# Patient Record
Sex: Male | Born: 1945 | Race: White | Hispanic: No | Marital: Married | Smoking: Never smoker
Health system: Southern US, Community
[De-identification: ages and names within clinical notes are randomized; demographics above are authoritative.]

## PROBLEM LIST (undated history)

## (undated) DIAGNOSIS — N201 Calculus of ureter: Secondary | ICD-10-CM

## (undated) DIAGNOSIS — M1990 Unspecified osteoarthritis, unspecified site: Secondary | ICD-10-CM

## (undated) DIAGNOSIS — I10 Essential (primary) hypertension: Secondary | ICD-10-CM

## (undated) DIAGNOSIS — E7800 Pure hypercholesterolemia, unspecified: Secondary | ICD-10-CM

## (undated) DIAGNOSIS — E669 Obesity, unspecified: Secondary | ICD-10-CM

## (undated) DIAGNOSIS — Z9889 Other specified postprocedural states: Secondary | ICD-10-CM

## (undated) DIAGNOSIS — K625 Hemorrhage of anus and rectum: Secondary | ICD-10-CM

## (undated) DIAGNOSIS — Z860100 Personal history of colon polyps, unspecified: Secondary | ICD-10-CM

## (undated) DIAGNOSIS — IMO0001 Reserved for inherently not codable concepts without codable children: Secondary | ICD-10-CM

## (undated) DIAGNOSIS — K219 Gastro-esophageal reflux disease without esophagitis: Secondary | ICD-10-CM

## (undated) DIAGNOSIS — R112 Nausea with vomiting, unspecified: Secondary | ICD-10-CM

## (undated) DIAGNOSIS — T4145XA Adverse effect of unspecified anesthetic, initial encounter: Secondary | ICD-10-CM

## (undated) DIAGNOSIS — T8859XA Other complications of anesthesia, initial encounter: Secondary | ICD-10-CM

## (undated) HISTORY — PX: PALATE / UVULA BIOPSY / EXCISION: SUR128

## (undated) HISTORY — PX: MOUTH SURGERY: SHX715

## (undated) HISTORY — PX: BILATERAL KNEE ARTHROSCOPY: SUR91

## (undated) HISTORY — PX: APPENDECTOMY: SHX54

## (undated) HISTORY — PX: TONSILLECTOMY: SUR1361

## (undated) HISTORY — PX: ESOPHAGOGASTRODUODENOSCOPY: SHX1529

---

## 2015-02-27 ENCOUNTER — Emergency Department (HOSPITAL_BASED_OUTPATIENT_CLINIC_OR_DEPARTMENT_OTHER)
Admission: EM | Admit: 2015-02-27 | Discharge: 2015-02-27 | Disposition: A | Discharge status: Home / Self Care | Payer: Medicare Other | Attending: Emergency Medicine | Admitting: Emergency Medicine

## 2015-02-27 ENCOUNTER — Encounter (HOSPITAL_BASED_OUTPATIENT_CLINIC_OR_DEPARTMENT_OTHER): Payer: Self-pay | Admitting: *Deleted

## 2015-02-27 ENCOUNTER — Emergency Department (HOSPITAL_BASED_OUTPATIENT_CLINIC_OR_DEPARTMENT_OTHER): Payer: Medicare Other

## 2015-02-27 DIAGNOSIS — I10 Essential (primary) hypertension: Secondary | ICD-10-CM | POA: Insufficient documentation

## 2015-02-27 DIAGNOSIS — N201 Calculus of ureter: Secondary | ICD-10-CM | POA: Diagnosis not present

## 2015-02-27 DIAGNOSIS — K219 Gastro-esophageal reflux disease without esophagitis: Secondary | ICD-10-CM | POA: Insufficient documentation

## 2015-02-27 DIAGNOSIS — R109 Unspecified abdominal pain: Secondary | ICD-10-CM | POA: Diagnosis present

## 2015-02-27 HISTORY — DX: Reserved for inherently not codable concepts without codable children: IMO0001

## 2015-02-27 HISTORY — DX: Gastro-esophageal reflux disease without esophagitis: K21.9

## 2015-02-27 HISTORY — DX: Essential (primary) hypertension: I10

## 2015-02-27 LAB — URINALYSIS, ROUTINE W REFLEX MICROSCOPIC
Bilirubin Urine: NEGATIVE
Glucose, UA: NEGATIVE mg/dL
Ketones, ur: NEGATIVE mg/dL
Leukocytes, UA: NEGATIVE
Nitrite: NEGATIVE
Protein, ur: NEGATIVE mg/dL
Specific Gravity, Urine: >1.046 — ABNORMAL HIGH (ref 1.005–1.030)
Urobilinogen, UA: 0.2 mg/dL (ref 0.0–1.0)
pH: 5 (ref 5.0–8.0)

## 2015-02-27 LAB — CBC WITH DIFFERENTIAL/PLATELET
Basophils Absolute: 0 10*3/uL (ref 0.0–0.1)
Basophils Relative: 0 % (ref 0–1)
Eosinophils Absolute: 0.2 10*3/uL (ref 0.0–0.7)
Eosinophils Relative: 2 % (ref 0–5)
HCT: 47.1 % (ref 39.0–52.0)
Hemoglobin: 16 g/dL (ref 13.0–17.0)
Lymphocytes Relative: 13 % (ref 12–46)
Lymphs Abs: 1.4 10*3/uL (ref 0.7–4.0)
MCH: 28.8 pg (ref 26.0–34.0)
MCHC: 34 g/dL (ref 30.0–36.0)
MCV: 84.7 fL (ref 78.0–100.0)
Monocytes Absolute: 0.7 10*3/uL (ref 0.1–1.0)
Monocytes Relative: 6 % (ref 3–12)
Neutro Abs: 8.8 10*3/uL — ABNORMAL HIGH (ref 1.7–7.7)
Neutrophils Relative %: 79 % — ABNORMAL HIGH (ref 43–77)
Platelets: 215 10*3/uL (ref 150–400)
RBC: 5.56 MIL/uL (ref 4.22–5.81)
RDW: 14 % (ref 11.5–15.5)
WBC: 11 10*3/uL — ABNORMAL HIGH (ref 4.0–10.5)

## 2015-02-27 LAB — BASIC METABOLIC PANEL
Anion gap: 13 (ref 5–15)
BUN: 32 mg/dL — ABNORMAL HIGH (ref 6–20)
CO2: 22 mmol/L (ref 22–32)
Calcium: 9.2 mg/dL (ref 8.9–10.3)
Chloride: 105 mmol/L (ref 101–111)
Creatinine, Ser: 1.26 mg/dL — ABNORMAL HIGH (ref 0.61–1.24)
GFR calc Af Amer: >60 mL/min (ref >60)
GFR calc non Af Amer: 57 mL/min — ABNORMAL LOW (ref >60)
Glucose, Bld: 140 mg/dL — ABNORMAL HIGH (ref 65–99)
Potassium: 3.6 mmol/L (ref 3.5–5.1)
Sodium: 140 mmol/L (ref 135–145)

## 2015-02-27 LAB — URINE MICROSCOPIC-ADD ON

## 2015-02-27 MED ORDER — IOHEXOL 300 MG/ML  SOLN
25.0000 mL | Freq: Once | INTRAMUSCULAR | Status: AC | PRN
  Administered 2015-02-27: 25 mL via ORAL

## 2015-02-27 MED ORDER — ONDANSETRON HCL 4 MG PO TABS: 4.0000 mg | ORAL_TABLET | Freq: Four times a day (QID) | ORAL | Status: DC

## 2015-02-27 MED ORDER — OXYCODONE-ACETAMINOPHEN 5-325 MG PO TABS: 1.0000 | ORAL_TABLET | ORAL | Status: DC | PRN

## 2015-02-27 MED ORDER — ONDANSETRON HCL 4 MG/2ML IJ SOLN: 4.0000 mg | Freq: Once | INTRAMUSCULAR | Status: DC

## 2015-02-27 MED ORDER — ONDANSETRON HCL 4 MG/2ML IJ SOLN
INTRAMUSCULAR | Status: AC
  Filled 2015-02-27: qty 2

## 2015-02-27 MED ORDER — SODIUM CHLORIDE 0.9 % IV BOLUS (SEPSIS)
1000.0000 mL | Freq: Once | INTRAVENOUS | Status: AC
Start: 2015-02-27 — End: 2015-02-27
  Administered 2015-02-27: 1000 mL via INTRAVENOUS

## 2015-02-27 MED ORDER — HYDROMORPHONE HCL 1 MG/ML IJ SOLN
1.0000 mg | Freq: Once | INTRAMUSCULAR | Status: AC
  Administered 2015-02-27: 1 mg via INTRAVENOUS
  Filled 2015-02-27: qty 1

## 2015-02-27 MED ORDER — ONDANSETRON HCL 4 MG/2ML IJ SOLN
4.0000 mg | Freq: Once | INTRAMUSCULAR | Status: AC
  Administered 2015-02-27: 4 mg via INTRAVENOUS
  Filled 2015-02-27: qty 2

## 2015-02-27 MED ORDER — IOHEXOL 300 MG/ML  SOLN
100.0000 mL | Freq: Once | INTRAMUSCULAR | Status: AC | PRN
  Administered 2015-02-27: 100 mL via INTRAVENOUS

## 2015-02-27 MED ORDER — KETOROLAC TROMETHAMINE 15 MG/ML IJ SOLN
15.0000 mg | Freq: Once | INTRAMUSCULAR | Status: AC
  Administered 2015-02-27: 15 mg via INTRAVENOUS
  Filled 2015-02-27: qty 1

## 2015-02-27 MED ORDER — ONDANSETRON HCL 4 MG/2ML IJ SOLN
4.0000 mg | Freq: Once | INTRAMUSCULAR | Status: AC
  Administered 2015-02-27: 4 mg via INTRAVENOUS

## 2015-02-27 NOTE — ED Notes (Signed)
Patient states he woke up this morning around 0300 with abdominal pain.  Describes the pain as a hard pain.  States he has had a loose bm today and a normal bm at 1711 yesterday,  but the pain is not improving.  States he is having nausea with dry heaves and weakness.

## 2015-02-27 NOTE — ED Notes (Signed)
Pt returns from ct scan. Reports relief of his pain, but although the nausea is better it is still present.

## 2015-02-27 NOTE — Discharge Instructions (Signed)

## 2015-02-27 NOTE — ED Notes (Addendum)
Pt states he is not able to produce urine sample at this time. States "I just went before I came here..." pt reassured that with iv fluids he will produce sample, pt agrees to notify staff when he feels the urge to void. Pt denies any pain or nausea.

## 2015-03-11 NOTE — ED Provider Notes (Signed)
CSN: [id number]     Arrival date & time 02/27/15  0708 History   First MD Initiated Contact with Patient 02/27/15 [phone]     Chief Complaint  Patient presents with  . Abdominal Pain     (Consider location/radiation/quality/duration/timing/severity/associated sxs/prior Treatment) HPI   69yM with abdominal pain. Onset around 0300 this morning. Pain is "deep" and "hard." No appreciable exacerbating or relieving factors. Worse on L abdomen. No urinary complaints. Two BMs today. No blood. Nausea and dry heaving. No fever or chills. No sick contacts. No hx of similar type pain.   Past Medical History  Diagnosis Date  . Hypertension   . Reflux    Past Surgical History  Procedure Laterality Date  . Appendectomy     No family history on file. History  Substance Use Topics  . Smoking status: Never Smoker   . Smokeless tobacco: Never Used  . Alcohol Use: Yes     Comment: social    Review of Systems All systems reviewed and negative, other than as noted in HPI.    Allergies  Review of patient's allergies indicates no known allergies.  Home Medications   Prior to Admission medications   Medication Sig Start Date End Date Taking? Authorizing Provider  AmLODIPine Besylate (NORVASC PO) Take by mouth.   Yes Historical Provider, MD  ATORVASTATIN CALCIUM PO Take by mouth.   Yes Historical Provider, MD  Esomeprazole Magnesium (NEXIUM PO) Take by mouth.   Yes Historical Provider, MD  ondansetron (ZOFRAN) 4 MG tablet Take 1 tablet (4 mg total) by mouth every 6 (six) hours. 02/27/15   Raeford Razor, MD  oxyCODONE-acetaminophen (PERCOCET/ROXICET) 5-325 MG per tablet Take 1-2 tablets by mouth every 4 (four) hours as needed for severe pain. 02/27/15   Raeford Razor, MD   BP 137/69 mmHg  Pulse 60  Temp(Src) 98 F (36.7 C) (Oral)  Resp 18  Ht  (1.803 m)  Wt 290 lb (131.543 kg)  BMI 40.46 kg/m2  SpO2 98% Physical Exam  Constitutional: He appears well-developed and well-nourished. No  distress.  HENT:  Head: Normocephalic and atraumatic.  Eyes: Conjunctivae are normal. Right eye exhibits no discharge. Left eye exhibits no discharge.  Neck: Neck supple.  Cardiovascular: Normal rate, regular rhythm and normal heart sounds.  Exam reveals no gallop and no friction rub.   No murmur heard. Pulmonary/Chest: Effort normal and breath sounds normal. No respiratory distress.  Abdominal: Soft. He exhibits no distension and no mass. There is tenderness. There is no rebound.  Mild L abdomen/L flank pain. L CVA tenderness.   Musculoskeletal: He exhibits no edema or tenderness.  Neurological: He is alert.  Skin: Skin is warm and dry.  Psychiatric: He has a normal mood and affect. His behavior is normal. Thought content normal.  Nursing note and vitals reviewed.   ED Course  Procedures (including critical care time) Labs Review Labs Reviewed  CBC WITH DIFFERENTIAL/PLATELET - Abnormal; Notable for the following:    WBC 11.0 (*)    Neutrophils Relative % 79 (*)    Neutro Abs 8.8 (*)    All other components within normal limits  BASIC METABOLIC PANEL - Abnormal; Notable for the following:    Glucose, Bld 140 (*)    BUN 32 (*)    Creatinine, Ser 1.26 (*)    GFR calc non Af Amer 57 (*)    All other components within normal limits  URINALYSIS, ROUTINE W REFLEX MICROSCOPIC (NOT AT [facility]) - Abnormal; Notable for  the following:    Specific Gravity, Urine >1.046 (*)    Hgb urine dipstick LARGE (*)    All other components within normal limits  URINE MICROSCOPIC-ADD ON    Imaging Review No results found.   EKG Interpretation   Date/Time:  Thursday February 27 2015 07:35:45 EDT Ventricular Rate:  68 PR Interval:  192 QRS Duration: 84 QT Interval:  436 QTC Calculation: 463 R Axis:   32 Text Interpretation:  Normal sinus rhythm Normal ECG Confirmed by Korissa Horsford   MD, Jorma Tassinari ([id number]) on 02/27/2015 7:54:58 AM      MDM   Final diagnoses:  Left ureteral stone   69yM with  abdominal/flank pain. CT significant for ureteral stone. Symptoms controlled. Afebrile. Expectant management.     Raeford Razor, MD 03/11/15 [phone]

## 2016-10-01 ENCOUNTER — Encounter: Payer: Self-pay | Admitting: Family Medicine

## 2017-03-30 ENCOUNTER — Ambulatory Visit (INDEPENDENT_AMBULATORY_CARE_PROVIDER_SITE_OTHER): Payer: Medicare Other

## 2017-03-30 ENCOUNTER — Ambulatory Visit (INDEPENDENT_AMBULATORY_CARE_PROVIDER_SITE_OTHER): Payer: Medicare Other | Admitting: Orthopaedic Surgery

## 2017-03-30 DIAGNOSIS — M1711 Unilateral primary osteoarthritis, right knee: Secondary | ICD-10-CM | POA: Diagnosis not present

## 2017-03-30 DIAGNOSIS — G8929 Other chronic pain: Secondary | ICD-10-CM | POA: Insufficient documentation

## 2017-03-30 DIAGNOSIS — M25561 Pain in right knee: Secondary | ICD-10-CM

## 2017-03-30 MED ORDER — METHYLPREDNISOLONE ACETATE 40 MG/ML IJ SUSP
40.0000 mg | INTRAMUSCULAR | Status: AC | PRN
  Administered 2017-03-30: 40 mg via INTRA_ARTICULAR

## 2017-03-30 MED ORDER — LIDOCAINE HCL 1 % IJ SOLN
3.0000 mL | INTRAMUSCULAR | Status: AC | PRN
  Administered 2017-03-30: 3 mL

## 2017-03-30 NOTE — Progress Notes (Signed)
Office Visit Note   Patient: Kyle Meyers           Date of Birth: 09-04-1945           MRN: [id number] Visit Date: 03/30/2017              Requested by: Rosario Adie, MD No address on file PCP: Patient, No Pcp Per   Assessment & Plan: Visit Diagnoses:  1. Chronic pain of right knee   2. Unilateral primary osteoarthritis, right knee     Plan: We went over his x-rays in detail and he understands with the severity of his knee arthritis that eventually he may need knee replacement surgery. I will have him work on quad strengthening exercises as well as activity modification and weight loss. He did wish to try steroid injection in the knee I agree with this. I prepped the knee with Betadine and alcohol carotid steroid injection without difficulty in the right knee. He is definitely a candidate for hyaluronic acid and asked we will place in his knee in 4 weeks and I talked about that in detail as well. All questions were encouraged and answered.  Follow-Up Instructions: Return in about 4 weeks (around 04/27/2017).   Orders:  Orders Placed This Encounter  Procedures  . Large Joint Injection/Arthrocentesis  . XR Knee 1-2 Views Right   No orders of the defined types were placed in this encounter.     Procedures: Large Joint Inj Date/Time: 03/30/2017 10:04 AM Performed by: Kathryne Hitch Authorized by: Kathryne Hitch   Location:  Knee Site:  R knee Ultrasound Guidance: No   Fluoroscopic Guidance: No   Arthrogram: No   Medications:  3 mL lidocaine 1 %; 40 mg methylPREDNISolone acetate 40 MG/ML     Clinical Data: No additional findings.   Subjective: No chief complaint on file. The patient is very pleasant 71 year old with chronic right knee pain. She is been worsening for to 3 years now. He is obese but does a lot of swimming at least 3 days a week. He says he can't stand long on hard surfaces without pain. He actually had surgery on that knee back  when he was much younger and he said this is more when he was in late teens and early 68s and had remove cartilage from the knee. He points to the medial joint line sources pain. Is a stabbing pain at times but he can be an aching pain that wakes him up at night. It is detrimentally affects his activities daily living, squatting life, and his mobility. He does take occasional over-the-counter medications to help with this. He's never had an injection in the knee. He is not a diabetic.  HPI  Review of Systems He currently denies any headache, shortness of breath, chest pain, fever, chills, nausea, vomiting.  Objective: Vital Signs: There were no vitals taken for this visit.  Physical Exam He is alert or 3 in no acute distress Ortho Exam He is a moderately obese individual. His right knee has varus malalignment. There is well-healed old surgical incisions around the knee. His knee has full range of motion is ligamentously stable but is significant medial joint line tenderness and patellofemoral crepitation. Specialty Comments:  No specialty comments available.  Imaging: Xr Knee 1-2 Views Right  Result Date: 03/30/2017 2 views of the right knee show varus malalignment with end-stage arthritis of the medial compartment mainly. There is peri-articular ossifies throughout the knee and sclerotic changes. There is  complete loss of medial joint space.    PMFS History: Patient Active Problem List   Diagnosis Date Noted  . Chronic pain of right knee 03/30/2017  . Unilateral primary osteoarthritis, right knee 03/30/2017   Past Medical History:  Diagnosis Date  . Hypertension   . Reflux     No family history on file.  Past Surgical History:  Procedure Laterality Date  . APPENDECTOMY     Social History   Occupational History  . Not on file.   Social History Main Topics  . Smoking status: Never Smoker  . Smokeless tobacco: Never Used  . Alcohol use Yes     Comment: social  . Drug  use: No  . Sexual activity: Not on file

## 2017-05-02 ENCOUNTER — Ambulatory Visit (INDEPENDENT_AMBULATORY_CARE_PROVIDER_SITE_OTHER): Payer: Medicare Other | Admitting: Orthopaedic Surgery

## 2017-05-09 ENCOUNTER — Ambulatory Visit (INDEPENDENT_AMBULATORY_CARE_PROVIDER_SITE_OTHER): Payer: Medicare Other | Admitting: Orthopaedic Surgery

## 2017-05-09 DIAGNOSIS — M1711 Unilateral primary osteoarthritis, right knee: Secondary | ICD-10-CM | POA: Diagnosis not present

## 2017-05-09 MED ORDER — HYLAN G-F 20 48 MG/6ML IX SOSY
48.0000 mg | PREFILLED_SYRINGE | INTRA_ARTICULAR | Status: AC | PRN
  Administered 2017-05-09: 48 mg via INTRA_ARTICULAR

## 2017-05-09 MED ORDER — LIDOCAINE HCL 1 % IJ SOLN
3.0000 mL | INTRAMUSCULAR | Status: AC | PRN
  Administered 2017-05-09: 3 mL

## 2017-05-09 NOTE — Progress Notes (Signed)
   Procedure Note  Patient: Kyle Meyers             Date of Birth: 01-Jan-1946           MRN: [id number]             Visit Date: 05/09/2017  Procedures: Visit Diagnoses: Unilateral primary osteoarthritis, right knee  Large Joint Inj Date/Time: 05/09/2017 9:53 AM Performed by: Kathryne Hitch Authorized by: Kathryne Hitch   Location:  Knee Ultrasound Guidance: No   Fluoroscopic Guidance: No   Arthrogram: No   Medications:  3 mL lidocaine 1 %; 48 mg Hylan 48 MG/6ML   The patient is here for a hyaluronic acid injection Synvisc 1 in his right knee to treat osteoarthritic pain. He's already had steroid injections before he has well-documented arthritis in that knee. On exam his painful right knee with range of motion. He has patellofemoral crepitation and varus malalignment.  He understands the rationale behind hyaluronic acid injections and tolerated this well. We had a long discussion about when to potentially have a steroid injection if he needs one in the future. All questions and concerns were answered and addressed.

## 2017-08-16 ENCOUNTER — Ambulatory Visit (INDEPENDENT_AMBULATORY_CARE_PROVIDER_SITE_OTHER): Payer: Medicare Other | Admitting: Physician Assistant

## 2017-08-16 ENCOUNTER — Encounter (INDEPENDENT_AMBULATORY_CARE_PROVIDER_SITE_OTHER): Payer: Self-pay | Admitting: Orthopaedic Surgery

## 2017-08-16 DIAGNOSIS — M1711 Unilateral primary osteoarthritis, right knee: Secondary | ICD-10-CM | POA: Diagnosis not present

## 2017-08-16 MED ORDER — METHYLPREDNISOLONE ACETATE 40 MG/ML IJ SUSP
40.0000 mg | INTRAMUSCULAR | Status: AC | PRN
  Administered 2017-08-16: 40 mg via INTRA_ARTICULAR

## 2017-08-16 MED ORDER — LIDOCAINE HCL 1 % IJ SOLN
3.0000 mL | INTRAMUSCULAR | Status: AC | PRN
  Administered 2017-08-16: 3 mL

## 2017-08-16 NOTE — Progress Notes (Signed)
   Procedure Note  Patient: Kyle MarshallJimmy Meyers             Date of Birth: Oct 04, 1945           MRN: [id number]             Visit Date: 08/16/2017  HPI: [name] is a 72 year old male who comes in today due to his right knee pain.  He has known end-stage osteoarthritis of his right knee.  He underwent a Synvisc this injection right knee on 05/09/2017.  States the injection did well for a month or so but his pain is returned to what it was prior to the injection.  Is requesting a cortisone injection in the knee today.  He is nondiabetic.  Physical exam: Right knee has good range of motion in the knee.  No abnormal warmth erythema or effusion. Procedures: Visit Diagnoses: Unilateral primary osteoarthritis, right knee  Large Joint Inj: R knee on 08/16/2017 10:25 AM Indications: pain Details: 22 G 1.5 in needle, anterolateral approach  Arthrogram: No  Medications: 3 mL lidocaine 1 %; 40 mg methylPREDNISolone acetate 40 MG/ML Outcome: tolerated well, no immediate complications Procedure, treatment alternatives, risks and benefits explained, specific risks discussed. Consent was given by the patient. Immediately prior to procedure a time out was called to verify the correct patient, procedure, equipment, support staff and site/side marked as required. Patient was prepped and draped in the usual sterile fashion.     Plan: He will follow-up in a month he is thinking about undergoing right total knee arthroplasty.  I did discuss with him some of the risk involved with total knee arthroplasty also the postoperative protocol.  Questions were encouraged and answered.

## 2017-08-17 ENCOUNTER — Ambulatory Visit (INDEPENDENT_AMBULATORY_CARE_PROVIDER_SITE_OTHER): Payer: Medicare Other | Admitting: Orthopaedic Surgery

## 2017-09-09 DIAGNOSIS — K625 Hemorrhage of anus and rectum: Secondary | ICD-10-CM

## 2017-09-09 HISTORY — DX: Hemorrhage of anus and rectum: K62.5

## 2017-09-09 HISTORY — PX: COLONOSCOPY: SHX174

## 2017-09-20 ENCOUNTER — Encounter (INDEPENDENT_AMBULATORY_CARE_PROVIDER_SITE_OTHER): Payer: Self-pay | Admitting: Orthopaedic Surgery

## 2017-09-20 ENCOUNTER — Ambulatory Visit (INDEPENDENT_AMBULATORY_CARE_PROVIDER_SITE_OTHER): Payer: Medicare Other | Admitting: Orthopaedic Surgery

## 2017-09-20 DIAGNOSIS — M1711 Unilateral primary osteoarthritis, right knee: Secondary | ICD-10-CM

## 2017-09-20 MED ORDER — HYDROCODONE-ACETAMINOPHEN 5-325 MG PO TABS: 1.0000 | ORAL_TABLET | Freq: Four times a day (QID) | ORAL | 0 refills | Status: DC | PRN

## 2017-09-20 NOTE — Addendum Note (Signed)
Addended by: Doneen PoissonBLACKMAN, Lylia Karn on: 09/20/2017 10:47 AM   Modules accepted: Orders

## 2017-09-20 NOTE — Progress Notes (Signed)
Office Visit Note   Patient: Kyle Meyers           Date of Birth: 1946-05-09           MRN: [id number] Visit Date: 09/20/2017              Requested by: No referring provider defined for this encounter. PCP: Patient, No Pcp Per   Assessment & Plan: Visit Diagnoses:  1. Unilateral primary osteoarthritis, right knee     Plan: At this point he would like to proceed with a total knee replacement.  He understands fully what the surgery involves and went over the risks and benefits of surgery in detail as well as what his intraoperative and postoperative course involved.  All questions concerns were answered and addressed.  We will work on getting this scheduled soon.  Follow-Up Instructions: Return for 2 weeks post-op.   Orders:  No orders of the defined types were placed in this encounter.  No orders of the defined types were placed in this encounter.     Procedures: No procedures performed   Clinical Data: No additional findings.   Subjective: Chief Complaint  Patient presents with  . Right Knee - Follow-up  Patient is here for continued follow-up of his right knee.  He has known and well-documented tricompartment arthritis of the right knee.  He has tried and failed conservative treatment now for well over a year.  He has a history of remote arthroscopic surgery on that right knee many years ago.  It got to be where his pain is daily.  It is 10 out of 10.  It is detrimentally affect is active daily living, quality of life, his mobility.  We have tried steroid injections in his knee as well as hyaluronic acid injections.  He has worked he is attempted weight loss as well.  On activity modification and quad strengthening exercises as well as anti-inflammatories.  At this point he does wish to proceed with a total knee arthroplasty given the failure of all his conservative treatment efforts.  He feels that his mobility is lessening and his knee is getting more stiff and his  range of motion is lessening as a result.   HPI  Review of Systems Currently denies any headache, chest pain, shortness of breath, fever, chills, nausea, vomiting.  Objective: Vital Signs: There were no vitals taken for this visit.  Physical Exam He is alert and oriented x3 and in no acute distress Ortho Exam Examination of his right knee shows varus malalignment.  There is significant medial joint line tenderness as well as patellofemoral crepitation.  His knee feels ligaments stable with good range of motion. Specialty Comments:  No specialty comments available.  Imaging: No results found. X-rays we have in our system that we reviewed again with him show the extent of his tricompartment arthritis of his right knee.  There is varus malalignment with near complete loss/severe narrowing of the medial joint line.  There is particular osteophytes in all 3 compartments.  PMFS History: Patient Active Problem List   Diagnosis Date Noted  . Chronic pain of right knee 03/30/2017  . Unilateral primary osteoarthritis, right knee 03/30/2017   Past Medical History:  Diagnosis Date  . Hypertension   . Reflux     History reviewed. No pertinent family history.  Past Surgical History:  Procedure Laterality Date  . APPENDECTOMY     Social History   Occupational History  . Not on file  Tobacco Use  .  Smoking status: Never Smoker  . Smokeless tobacco: Never Used  Substance and Sexual Activity  . Alcohol use: Yes    Comment: social  . Drug use: No  . Sexual activity: Not on file

## 2017-10-05 NOTE — Patient Instructions (Addendum)
Kyle Meyers  10/05/2017   Your procedure is scheduled on: 10-14-17   Report to [facility] Main  Entrance ARRIVE AT 530 AM. Have a seat in the Main Lobby. Please note there is a phone at the [employer]k. Please call [phone] on that phone. Someone from Short Stay will come and get you from the Main Lobby and take you to Short Stay.    Call this number if you have problems the morning of surgery [phone]   Remember: Do not eat food or drink liquids :After Midnight.     Take these medicines the morning of surgery with A SIP OF WATER: Amlodipine (Norvasc), and Atorvastatin (Lipitor)                                You may not have any metal on your body including hair pins and              piercings  Do not wear jewelry, lotions, powders or deodorant             Men may shave face and neck.   Do not bring valuables to the hospital. [name] IS NOT             RESPONSIBLE   FOR VALUABLES.  Contacts, dentures or bridgework may not be worn into surgery.  Leave suitcase in the car. After surgery it may be brought to your room.   Please read over the following fact sheets you were given: _____________________________________________________________________          [facility] - Preparing for Surgery Before surgery, you can play an important role.  Because skin is not sterile, your skin needs to be as free of germs as possible.  You can reduce the number of germs on your skin by washing with CHG (chlorahexidine gluconate) soap before surgery.  CHG is an antiseptic cleaner which kills germs and bonds with the skin to continue killing germs even after washing. Please DO NOT use if you have an allergy to CHG or antibacterial soaps.  If your skin becomes reddened/irritated stop using the CHG and inform your nurse when you arrive at Short Stay. Do not shave (including legs and underarms) for at least 48 hours prior to the first CHG shower.  You may  shave your face/neck. Please follow these instructions carefully:  1.  Shower with CHG Soap the night before surgery and the  morning of Surgery.  2.  If you choose to wash your hair, wash your hair first as usual with your  normal  shampoo.  3.  After you shampoo, rinse your hair and body thoroughly to remove the  shampoo.                           4.  Use CHG as you would any other liquid soap.  You can apply chg directly  to the skin and wash                       Gently with a scrungie or clean washcloth.  5.  Apply the CHG Soap to your body ONLY FROM THE NECK DOWN.   Do not use on face/ open  Wound or open sores. Avoid contact with eyes, ears mouth and genitals (private parts).                       Wash face,  Genitals (private parts) with your normal soap.             6.  Wash thoroughly, paying special attention to the area where your surgery  will be performed.  7.  Thoroughly rinse your body with warm water from the neck down.  8.  DO NOT shower/wash with your normal soap after using and rinsing off  the CHG Soap.                9.  Pat yourself dry with a clean towel.            10.  Wear clean pajamas.            11.  Place clean sheets on your bed the night of your first shower and do not  sleep with pets. Day of Surgery : Do not apply any lotions/deodorants the morning of surgery.  Please wear clean clothes to the hospital/surgery center.  FAILURE TO FOLLOW THESE INSTRUCTIONS MAY RESULT IN THE CANCELLATION OF YOUR SURGERY PATIENT SIGNATURE_________________________________  NURSE SIGNATURE__________________________________  ________________________________________________________________________   Kyle Meyers  An incentive spirometer is a tool that can help keep your lungs clear and active. This tool measures how well you are filling your lungs with each breath. Taking long deep breaths may help reverse or decrease the chance of developing  breathing (pulmonary) problems (especially infection) following:  A long period of time when you are unable to move or be active. BEFORE THE PROCEDURE   If the spirometer includes an indicator to show your best effort, your nurse or respiratory therapist will set it to a desired goal.  If possible, sit up straight or lean slightly forward. Try not to slouch.  Hold the incentive spirometer in an upright position. INSTRUCTIONS FOR USE  1. Sit on the edge of your bed if possible, or sit up as far as you can in bed or on a chair. 2. Hold the incentive spirometer in an upright position. 3. Breathe out normally. 4. Place the mouthpiece in your mouth and seal your lips tightly around it. 5. Breathe in slowly and as deeply as possible, raising the piston or the ball toward the top of the column. 6. Hold your breath for 3-5 seconds or for as long as possible. Allow the piston or ball to fall to the bottom of the column. 7. Remove the mouthpiece from your mouth and breathe out normally. 8. Rest for a few seconds and repeat Steps 1 through 7 at least 10 times every 1-2 hours when you are awake. Take your time and take a few normal breaths between deep breaths. 9. The spirometer may include an indicator to show your best effort. Use the indicator as a goal to work toward during each repetition. 10. After each set of 10 deep breaths, practice coughing to be sure your lungs are clear. If you have an incision (the cut made at the time of surgery), support your incision when coughing by placing a pillow or rolled up towels firmly against it. Once you are able to get out of bed, walk around indoors and cough well. You may stop using the incentive spirometer when instructed by your caregiver.  RISKS AND COMPLICATIONS  Take your time so you do not get  dizzy or light-headed.  If you are in pain, you may need to take or ask for pain medication before doing incentive spirometry. It is harder to take a deep  breath if you are having pain. AFTER USE  Rest and breathe slowly and easily.  It can be helpful to keep track of a log of your progress. Your caregiver can provide you with a simple table to help with this. If you are using the spirometer at home, follow these instructions: [place] IF:   You are having difficultly using the spirometer.  You have trouble using the spirometer as often as instructed.  Your pain medication is not giving enough relief while using the spirometer.  You develop fever of 100.5 F (38.1 C) or higher. SEEK IMMEDIATE MEDICAL CARE IF:   You cough up bloody sputum that had not been present before.  You develop fever of 102 F (38.9 C) or greater.  You develop worsening pain at or near the incision site. MAKE SURE YOU:   Understand these instructions.  Will watch your condition.  Will get help right away if you are not doing well or get worse. Document Released: 12/06/2006 Document Revised: 10/18/2011 Document Reviewed: 02/06/2007 [employer] Patient Information 2014 [employer], [place].   ________________________________________________________________________  WHAT IS A BLOOD TRANSFUSION? Blood Transfusion Information  A transfusion is the replacement of blood or some of its parts. Blood is made up of multiple cells which provide different functions.  Red blood cells carry oxygen and are used for blood loss replacement.  White blood cells fight against infection.  Platelets control bleeding.  Plasma helps clot blood.  Other blood products are available for specialized needs, such as hemophilia or other clotting disorders. BEFORE THE TRANSFUSION  Who gives blood for transfusions?   Healthy volunteers who are fully evaluated to make sure their blood is safe. This is blood bank blood. Transfusion therapy is the safest it has ever been in the practice of medicine. Before blood is taken from a donor, a complete history is taken to make sure  that person has no history of diseases nor engages in risky social behavior (examples are intravenous drug use or sexual activity with multiple partners). The donor's travel history is screened to minimize risk of transmitting infections, such as malaria. The donated blood is tested for signs of infectious diseases, such as HIV and hepatitis. The blood is then tested to be sure it is compatible with you in order to minimize the chance of a transfusion reaction. If you or a relative donates blood, this is often done in anticipation of surgery and is not appropriate for emergency situations. It takes many days to process the donated blood. RISKS AND COMPLICATIONS Although transfusion therapy is very safe and saves many lives, the main dangers of transfusion include:   Getting an infectious disease.  Developing a transfusion reaction. This is an allergic reaction to something in the blood you were given. Every precaution is taken to prevent this. The decision to have a blood transfusion has been considered carefully by your caregiver before blood is given. Blood is not given unless the benefits outweigh the risks. AFTER THE TRANSFUSION  Right after receiving a blood transfusion, you will usually feel much better and more energetic. This is especially true if your red blood cells have gotten low (anemic). The transfusion raises the level of the red blood cells which carry oxygen, and this usually causes an energy increase.  The nurse administering the transfusion will  monitor you carefully for complications. HOME CARE INSTRUCTIONS  No special instructions are needed after a transfusion. You may find your energy is better. Speak with your caregiver about any limitations on activity for underlying diseases you may have. SEEK MEDICAL CARE IF:   Your condition is not improving after your transfusion.  You develop redness or irritation at the intravenous (IV) site. SEEK IMMEDIATE MEDICAL CARE IF:  Any of  the following symptoms occur over the next 12 hours:  Shaking chills.  You have a temperature by mouth above 102 F (38.9 C), not controlled by medicine.  Chest, back, or muscle pain.  People around you feel you are not acting correctly or are confused.  Shortness of breath or difficulty breathing.  Dizziness and fainting.  You get a rash or develop hives.  You have a decrease in urine output.  Your urine turns a dark color or changes to pink, red, or brown. Any of the following symptoms occur over the next 10 days:  You have a temperature by mouth above 102 F (38.9 C), not controlled by medicine.  Shortness of breath.  Weakness after normal activity.  The white part of the eye turns yellow (jaundice).  You have a decrease in the amount of urine or are urinating less often.  Your urine turns a dark color or changes to pink, red, or brown. Document Released: 07/23/2000 Document Revised: 10/18/2011 Document Reviewed: 03/11/2008 [facility] Patient Information 2014 [place], [place].  _______________________________________________________________________

## 2017-10-06 ENCOUNTER — Other Ambulatory Visit: Payer: Self-pay

## 2017-10-06 ENCOUNTER — Encounter (HOSPITAL_COMMUNITY): Payer: Self-pay

## 2017-10-06 ENCOUNTER — Encounter (HOSPITAL_COMMUNITY)
Admission: RE | Admit: 2017-10-06 | Discharge: 2017-10-06 | Disposition: A | Discharge status: Home / Self Care | Payer: Medicare Other | Source: Ambulatory Visit | Attending: Orthopaedic Surgery | Admitting: Orthopaedic Surgery

## 2017-10-06 DIAGNOSIS — I10 Essential (primary) hypertension: Secondary | ICD-10-CM | POA: Diagnosis not present

## 2017-10-06 DIAGNOSIS — Z01812 Encounter for preprocedural laboratory examination: Secondary | ICD-10-CM | POA: Insufficient documentation

## 2017-10-06 DIAGNOSIS — Z01810 Encounter for preprocedural cardiovascular examination: Secondary | ICD-10-CM | POA: Insufficient documentation

## 2017-10-06 HISTORY — DX: Adverse effect of unspecified anesthetic, initial encounter: T41.45XA

## 2017-10-06 HISTORY — DX: Gastro-esophageal reflux disease without esophagitis: K21.9

## 2017-10-06 HISTORY — DX: Nausea with vomiting, unspecified: R11.2

## 2017-10-06 HISTORY — DX: Other specified postprocedural states: Z98.890

## 2017-10-06 HISTORY — DX: Other complications of anesthesia, initial encounter: T88.59XA

## 2017-10-06 LAB — CBC
HEMATOCRIT: 44.6 % (ref 39.0–52.0)
Hemoglobin: 14.7 g/dL (ref 13.0–17.0)
MCH: 28.8 pg (ref 26.0–34.0)
MCHC: 33 g/dL (ref 30.0–36.0)
MCV: 87.3 fL (ref 78.0–100.0)
Platelets: 232 10*3/uL (ref 150–400)
RBC: 5.11 MIL/uL (ref 4.22–5.81)
RDW: 14.3 % (ref 11.5–15.5)
WBC: 5.4 10*3/uL (ref 4.0–10.5)

## 2017-10-06 LAB — BASIC METABOLIC PANEL
ANION GAP: 9 (ref 5–15)
BUN: 25 mg/dL — AB (ref 6–20)
CALCIUM: 9.2 mg/dL (ref 8.9–10.3)
CO2: 26 mmol/L (ref 22–32)
Chloride: 104 mmol/L (ref 101–111)
Creatinine, Ser: 0.97 mg/dL (ref 0.61–1.24)
GFR calc Af Amer: >60 mL/min (ref >60)
GFR calc non Af Amer: >60 mL/min (ref >60)
GLUCOSE: 96 mg/dL (ref 65–99)
Potassium: 4.2 mmol/L (ref 3.5–5.1)
Sodium: 139 mmol/L (ref 135–145)

## 2017-10-06 LAB — SURGICAL PCR SCREEN
MRSA, PCR: NEGATIVE
Staphylococcus aureus: NEGATIVE

## 2017-10-06 NOTE — Progress Notes (Signed)
10-06-17 BMP routed to Dr. Magnus IvanBlackman for review

## 2017-10-07 ENCOUNTER — Telehealth (INDEPENDENT_AMBULATORY_CARE_PROVIDER_SITE_OTHER): Payer: Self-pay | Admitting: Orthopaedic Surgery

## 2017-10-07 NOTE — Telephone Encounter (Signed)
He probably should not have a colonoscopy the same week as a total knee replacement.

## 2017-10-07 NOTE — Telephone Encounter (Signed)
He is asking how long should her wait?

## 2017-10-07 NOTE — Telephone Encounter (Signed)
I think he is going to reschedule his knee surgery, he is having some GI issues and needs a colonoscopy, he will call back and let you know Monday

## 2017-10-07 NOTE — Telephone Encounter (Signed)
Advise

## 2017-10-07 NOTE — Telephone Encounter (Signed)
About one month

## 2017-10-07 NOTE — Telephone Encounter (Signed)
Patient called and wanted to know if he has a colonoscopy before the surgery on Friday, will that pose a problem or does he need to put it off until after the surgery.  CB#[phone].  Thank you.

## 2017-10-10 ENCOUNTER — Other Ambulatory Visit (INDEPENDENT_AMBULATORY_CARE_PROVIDER_SITE_OTHER): Payer: Self-pay | Admitting: Physician Assistant

## 2017-10-10 NOTE — Telephone Encounter (Signed)
See below

## 2017-10-10 NOTE — Telephone Encounter (Signed)
Patient met with GI doctor this morning, he will have the colonoscopy on Monday so he will need to postpone his knee surgery. He is hoping to reschedule for the first week in April, please call to reschedule # [phone]

## 2017-10-20 NOTE — Telephone Encounter (Signed)
I called patient and rescheduled surgery. 

## 2017-10-27 ENCOUNTER — Inpatient Hospital Stay (INDEPENDENT_AMBULATORY_CARE_PROVIDER_SITE_OTHER): Payer: Medicare Other | Admitting: Physician Assistant

## 2017-11-09 ENCOUNTER — Telehealth (INDEPENDENT_AMBULATORY_CARE_PROVIDER_SITE_OTHER): Payer: Self-pay | Admitting: Orthopaedic Surgery

## 2017-11-09 NOTE — Telephone Encounter (Signed)
Patient called to state that he is having surgery on 4/12 right knee replacement. Patient has a granddaughter graduating on 12/10/17 and wanted t know if he will be able to make it to [place] on that day.  Please call patient to advise

## 2017-11-09 NOTE — Telephone Encounter (Signed)
Patient aware of the below message  

## 2017-11-09 NOTE — Telephone Encounter (Signed)
Please advise 

## 2017-11-09 NOTE — Telephone Encounter (Signed)
I have no problem with him traveling after having a knee replacement.  He will be going slowly and still may be using a walker or a cane at that standpoint.

## 2017-11-11 ENCOUNTER — Encounter (HOSPITAL_COMMUNITY): Payer: Self-pay

## 2017-11-11 NOTE — Patient Instructions (Signed)
Your procedure is scheduled on: Friday, November 18, 2017   Surgery Time:  9:00AM-11:00AM   Report to [facility] Main  Entrance    Report to admitting at 6:30 AM   Call this number if you have problems the morning of surgery [phone]   Do not eat food or drink liquids :After Midnight.   Do NOT smoke after Midnight   Take these medicines the morning of surgery with A SIP OF WATER: Amlodipine, Atorvastatin                               You may not have any metal on your body including jewelry, and body piercings             Do not wear lotions, powders, perfumes/cologne, or deodorant                          Men may shave face and neck.   Do not bring valuables to the hospital. [place] IS NOT             RESPONSIBLE   FOR VALUABLES.   Contacts, dentures or bridgework may not be worn into surgery.   Leave suitcase in the car. After surgery it may be brought to your room.   Special Instructions: Bring a copy of your healthcare power of attorney and living will documents         the day of surgery if you haven't scanned them in before.              Please read over the following fact sheets you were given:  [facility] - Preparing for Surgery Before surgery, you can play an important role.  Because skin is not sterile, your skin needs to be as free of germs as possible.  You can reduce the number of germs on your skin by washing with CHG (chlorahexidine gluconate) soap before surgery.  CHG is an antiseptic cleaner which kills germs and bonds with the skin to continue killing germs even after washing. Please DO NOT use if you have an allergy to CHG or antibacterial soaps.  If your skin becomes reddened/irritated stop using the CHG and inform your nurse when you arrive at Short Stay. Do not shave (including legs and underarms) for at least 48 hours prior to the first CHG shower.  You may shave your face/neck.  Please follow these instructions carefully:  1.  Shower  with CHG Soap the night before surgery and the  morning of surgery.  2.  If you choose to wash your hair, wash your hair first as usual with your normal  shampoo.  3.  After you shampoo, rinse your hair and body thoroughly to remove the shampoo.                             4.  Use CHG as you would any other liquid soap.  You can apply chg directly to the skin and wash.  Gently with a scrungie or clean washcloth.  5.  Apply the CHG Soap to your body ONLY FROM THE NECK DOWN.   Do   not use on face/ open                           Wound or open  sores. Avoid contact with eyes, ears mouth and   genitals (private parts).                       Wash face,  Genitals (private parts) with your normal soap.             6.  Wash thoroughly, paying special attention to the area where your    surgery  will be performed.  7.  Thoroughly rinse your body with warm water from the neck down.  8.  DO NOT shower/wash with your normal soap after using and rinsing off the CHG Soap.                9.  Pat yourself dry with a clean towel.            10.  Wear clean pajamas.            11.  Place clean sheets on your bed the night of your first shower and do not  sleep with pets. Day of Surgery : Do not apply any lotions/deodorants the morning of surgery.  Please wear clean clothes to the hospital/surgery center.  FAILURE TO FOLLOW THESE INSTRUCTIONS MAY RESULT IN THE CANCELLATION OF YOUR SURGERY  PATIENT SIGNATURE_________________________________  NURSE SIGNATURE__________________________________  ________________________________________________________________________   [name]  An incentive spirometer is a tool that can help keep your lungs clear and active. This tool measures how well you are filling your lungs with each breath. Taking long deep breaths may help reverse or decrease the chance of developing breathing (pulmonary) problems (especially infection) following:  A long period of time when  you are unable to move or be active. BEFORE THE PROCEDURE   If the spirometer includes an indicator to show your best effort, your nurse or respiratory therapist will set it to a desired goal.  If possible, sit up straight or lean slightly forward. Try not to slouch.  Hold the incentive spirometer in an upright position. INSTRUCTIONS FOR USE  1. Sit on the edge of your bed if possible, or sit up as far as you can in bed or on a chair. 2. Hold the incentive spirometer in an upright position. 3. Breathe out normally. 4. Place the mouthpiece in your mouth and seal your lips tightly around it. 5. Breathe in slowly and as deeply as possible, raising the piston or the ball toward the top of the column. 6. Hold your breath for 3-5 seconds or for as long as possible. Allow the piston or ball to fall to the bottom of the column. 7. Remove the mouthpiece from your mouth and breathe out normally. 8. Rest for a few seconds and repeat Steps 1 through 7 at least 10 times every 1-2 hours when you are awake. Take your time and take a few normal breaths between deep breaths. 9. The spirometer may include an indicator to show your best effort. Use the indicator as a goal to work toward during each repetition. 10. After each set of 10 deep breaths, practice coughing to be sure your lungs are clear. If you have an incision (the cut made at the time of surgery), support your incision when coughing by placing a pillow or rolled up towels firmly against it. Once you are able to get out of bed, walk around indoors and cough well. You may stop using the incentive spirometer when instructed by your caregiver.  RISKS AND COMPLICATIONS  Take your time so you do  not get dizzy or light-headed.  If you are in pain, you may need to take or ask for pain medication before doing incentive spirometry. It is harder to take a deep breath if you are having pain. AFTER USE  Rest and breathe slowly and easily.  It can be  helpful to keep track of a log of your progress. Your caregiver can provide you with a simple table to help with this. If you are using the spirometer at home, follow these instructions: SEEK MEDICAL CARE IF:   You are having difficultly using the spirometer.  You have trouble using the spirometer as often as instructed.  Your pain medication is not giving enough relief while using the spirometer.  You develop fever of 100.5 F (38.1 C) or higher. SEEK IMMEDIATE MEDICAL CARE IF:   You cough up bloody sputum that had not been present before.  You develop fever of 102 F (38.9 C) or greater.  You develop worsening pain at or near the incision site. MAKE SURE YOU:   Understand these instructions.  Will watch your condition.  Will get help right away if you are not doing well or get worse. Document Released: 12/06/2006 Document Revised: 10/18/2011 Document Reviewed: 02/06/2007 [facility] Patient Information 2014 [place], [place].   ________________________________________________________________________

## 2017-11-11 NOTE — Pre-Procedure Instructions (Signed)
EKG 10/06/2017 in epic

## 2017-11-14 ENCOUNTER — Encounter (HOSPITAL_COMMUNITY): Payer: Self-pay

## 2017-11-14 ENCOUNTER — Encounter (HOSPITAL_COMMUNITY)
Admission: RE | Admit: 2017-11-14 | Discharge: 2017-11-14 | Disposition: A | Discharge status: Home / Self Care | Payer: Medicare Other | Source: Ambulatory Visit | Attending: Orthopaedic Surgery | Admitting: Orthopaedic Surgery

## 2017-11-14 ENCOUNTER — Other Ambulatory Visit: Payer: Self-pay

## 2017-11-14 DIAGNOSIS — M1711 Unilateral primary osteoarthritis, right knee: Secondary | ICD-10-CM | POA: Insufficient documentation

## 2017-11-14 DIAGNOSIS — Z01812 Encounter for preprocedural laboratory examination: Secondary | ICD-10-CM | POA: Diagnosis present

## 2017-11-14 HISTORY — DX: Personal history of colonic polyps: Z86.010

## 2017-11-14 HISTORY — DX: Pure hypercholesterolemia, unspecified: E78.00

## 2017-11-14 HISTORY — DX: Calculus of ureter: N20.1

## 2017-11-14 HISTORY — DX: Unspecified osteoarthritis, unspecified site: M19.90

## 2017-11-14 HISTORY — DX: Personal history of colon polyps, unspecified: Z86.0100

## 2017-11-14 HISTORY — DX: Obesity, unspecified: E66.9

## 2017-11-14 HISTORY — DX: Hemorrhage of anus and rectum: K62.5

## 2017-11-14 LAB — BASIC METABOLIC PANEL
Anion gap: 10 (ref 5–15)
BUN: 32 mg/dL — AB (ref 6–20)
CHLORIDE: 104 mmol/L (ref 101–111)
CO2: 23 mmol/L (ref 22–32)
Calcium: 9 mg/dL (ref 8.9–10.3)
Creatinine, Ser: 1.11 mg/dL (ref 0.61–1.24)
GFR calc Af Amer: >60 mL/min (ref >60)
GFR calc non Af Amer: >60 mL/min (ref >60)
GLUCOSE: 91 mg/dL (ref 65–99)
Potassium: 3.9 mmol/L (ref 3.5–5.1)
Sodium: 137 mmol/L (ref 135–145)

## 2017-11-14 LAB — CBC
HCT: 46 % (ref 39.0–52.0)
Hemoglobin: 15.3 g/dL (ref 13.0–17.0)
MCH: 28.7 pg (ref 26.0–34.0)
MCHC: 33.3 g/dL (ref 30.0–36.0)
MCV: 86.1 fL (ref 78.0–100.0)
Platelets: 206 10*3/uL (ref 150–400)
RBC: 5.34 MIL/uL (ref 4.22–5.81)
RDW: 14.1 % (ref 11.5–15.5)
WBC: 5 10*3/uL (ref 4.0–10.5)

## 2017-11-14 LAB — SURGICAL PCR SCREEN
MRSA, PCR: NEGATIVE
STAPHYLOCOCCUS AUREUS: NEGATIVE

## 2017-11-14 NOTE — Pre-Procedure Instructions (Signed)
BMP results 11/14/2017 faxed to Dr. Magnus IvanBlackman via epic.

## 2017-11-17 MED ORDER — TRANEXAMIC ACID 1000 MG/10ML IV SOLN
1000.0000 mg | INTRAVENOUS | Status: AC
  Administered 2017-11-18: 1000 mg via INTRAVENOUS
  Filled 2017-11-17: qty 1100

## 2017-11-17 MED ORDER — DEXTROSE 5 % IV SOLN
3.0000 g | INTRAVENOUS | Status: AC
  Administered 2017-11-18: 3 g via INTRAVENOUS
  Filled 2017-11-17: qty 3

## 2017-11-18 ENCOUNTER — Encounter (HOSPITAL_COMMUNITY)
Admission: RE | Disposition: A | Discharge status: Home Health | Payer: Self-pay | Source: Ambulatory Visit | Attending: Orthopaedic Surgery

## 2017-11-18 ENCOUNTER — Inpatient Hospital Stay (HOSPITAL_COMMUNITY)
Admission: RE | Admit: 2017-11-18 | Discharge: 2017-11-20 | DRG: 470 | Disposition: A | Discharge status: Home Health | Payer: Medicare Other | Source: Ambulatory Visit | Attending: Orthopaedic Surgery | Admitting: Orthopaedic Surgery

## 2017-11-18 ENCOUNTER — Encounter (HOSPITAL_COMMUNITY): Payer: Self-pay | Admitting: *Deleted

## 2017-11-18 ENCOUNTER — Ambulatory Visit (HOSPITAL_COMMUNITY): Payer: Medicare Other | Admitting: Certified Registered"

## 2017-11-18 ENCOUNTER — Other Ambulatory Visit: Payer: Self-pay

## 2017-11-18 ENCOUNTER — Observation Stay (HOSPITAL_COMMUNITY): Payer: Medicare Other

## 2017-11-18 DIAGNOSIS — M1711 Unilateral primary osteoarthritis, right knee: Principal | ICD-10-CM

## 2017-11-18 DIAGNOSIS — Z96651 Presence of right artificial knee joint: Secondary | ICD-10-CM

## 2017-11-18 DIAGNOSIS — E7800 Pure hypercholesterolemia, unspecified: Secondary | ICD-10-CM | POA: Diagnosis present

## 2017-11-18 DIAGNOSIS — G8929 Other chronic pain: Secondary | ICD-10-CM | POA: Diagnosis present

## 2017-11-18 DIAGNOSIS — K219 Gastro-esophageal reflux disease without esophagitis: Secondary | ICD-10-CM | POA: Diagnosis present

## 2017-11-18 DIAGNOSIS — Z6839 Body mass index (BMI) 39.0-39.9, adult: Secondary | ICD-10-CM

## 2017-11-18 DIAGNOSIS — I10 Essential (primary) hypertension: Secondary | ICD-10-CM | POA: Diagnosis present

## 2017-11-18 HISTORY — PX: TOTAL KNEE ARTHROPLASTY: SHX125

## 2017-11-18 SURGERY — ARTHROPLASTY, KNEE, TOTAL
Anesthesia: General | Site: Knee | Laterality: Right

## 2017-11-18 MED ORDER — PANTOPRAZOLE SODIUM 40 MG PO TBEC
80.0000 mg | DELAYED_RELEASE_TABLET | Freq: Every day | ORAL | Status: DC
  Administered 2017-11-18 – 2017-11-20 (×3): 80 mg via ORAL
  Filled 2017-11-18 (×4): qty 2

## 2017-11-18 MED ORDER — ACETAMINOPHEN 325 MG PO TABS: 325.0000 mg | ORAL_TABLET | Freq: Four times a day (QID) | ORAL | Status: DC | PRN

## 2017-11-18 MED ORDER — MIDAZOLAM HCL 2 MG/2ML IJ SOLN
INTRAMUSCULAR | Status: AC
  Filled 2017-11-18: qty 2

## 2017-11-18 MED ORDER — VITAMIN D3 25 MCG (1000 UNIT) PO TABS
2000.0000 [IU] | ORAL_TABLET | Freq: Every day | ORAL | Status: DC
  Administered 2017-11-19 – 2017-11-20 (×2): 2000 [IU] via ORAL
  Filled 2017-11-18 (×3): qty 2

## 2017-11-18 MED ORDER — ONDANSETRON HCL 4 MG PO TABS: 4.0000 mg | ORAL_TABLET | Freq: Four times a day (QID) | ORAL | Status: DC | PRN

## 2017-11-18 MED ORDER — LIDOCAINE 2% (20 MG/ML) 5 ML SYRINGE
INTRAMUSCULAR | Status: DC | PRN
  Administered 2017-11-18: 40 mg via INTRAVENOUS
  Administered 2017-11-18: 60 mg via INTRAVENOUS

## 2017-11-18 MED ORDER — OXYCODONE HCL ER 10 MG PO T12A
10.0000 mg | EXTENDED_RELEASE_TABLET | Freq: Two times a day (BID) | ORAL | Status: DC
  Administered 2017-11-18 – 2017-11-20 (×4): 10 mg via ORAL
  Filled 2017-11-18 (×4): qty 1

## 2017-11-18 MED ORDER — SODIUM CHLORIDE 0.9 % IV SOLN
INTRAVENOUS | Status: DC
  Administered 2017-11-18: 12:00:00 via INTRAVENOUS

## 2017-11-18 MED ORDER — RIVAROXABAN 10 MG PO TABS
10.0000 mg | ORAL_TABLET | Freq: Every day | ORAL | Status: DC
  Administered 2017-11-19 – 2017-11-20 (×2): 10 mg via ORAL
  Filled 2017-11-18 (×2): qty 1

## 2017-11-18 MED ORDER — ONDANSETRON HCL 4 MG/2ML IJ SOLN
INTRAMUSCULAR | Status: DC | PRN
  Administered 2017-11-18: 4 mg via INTRAVENOUS

## 2017-11-18 MED ORDER — OXYCODONE HCL 5 MG PO TABS
10.0000 mg | ORAL_TABLET | ORAL | Status: DC | PRN
  Administered 2017-11-18 – 2017-11-20 (×3): 10 mg via ORAL
  Filled 2017-11-18 (×3): qty 2

## 2017-11-18 MED ORDER — DIPHENHYDRAMINE HCL 12.5 MG/5ML PO ELIX: 12.5000 mg | ORAL_SOLUTION | ORAL | Status: DC | PRN

## 2017-11-18 MED ORDER — FENTANYL CITRATE (PF) 250 MCG/5ML IJ SOLN
INTRAMUSCULAR | Status: DC | PRN
  Administered 2017-11-18: 25 ug via INTRAVENOUS
  Administered 2017-11-18: 50 ug via INTRAVENOUS
  Administered 2017-11-18 (×5): 25 ug via INTRAVENOUS
  Administered 2017-11-18: 50 ug via INTRAVENOUS

## 2017-11-18 MED ORDER — ONDANSETRON HCL 4 MG/2ML IJ SOLN: 4.0000 mg | Freq: Four times a day (QID) | INTRAMUSCULAR | Status: DC | PRN

## 2017-11-18 MED ORDER — SODIUM CHLORIDE 0.9 % IJ SOLN
INTRAMUSCULAR | Status: AC
  Filled 2017-11-18: qty 50

## 2017-11-18 MED ORDER — ADULT MULTIVITAMIN W/MINERALS CH
1.0000 | ORAL_TABLET | Freq: Every day | ORAL | Status: DC
  Administered 2017-11-19 – 2017-11-20 (×2): 1 via ORAL
  Filled 2017-11-18 (×3): qty 1

## 2017-11-18 MED ORDER — HYDROCHLOROTHIAZIDE 12.5 MG PO CAPS
12.5000 mg | ORAL_CAPSULE | Freq: Every day | ORAL | Status: DC
  Administered 2017-11-19 – 2017-11-20 (×2): 12.5 mg via ORAL
  Filled 2017-11-18 (×3): qty 1

## 2017-11-18 MED ORDER — AMLODIPINE BESYLATE 5 MG PO TABS
5.0000 mg | ORAL_TABLET | Freq: Every day | ORAL | Status: DC
  Administered 2017-11-19 – 2017-11-20 (×2): 5 mg via ORAL
  Filled 2017-11-18 (×3): qty 1

## 2017-11-18 MED ORDER — FENTANYL CITRATE (PF) 100 MCG/2ML IJ SOLN
50.0000 ug | INTRAMUSCULAR | Status: DC
  Administered 2017-11-18: 100 ug via INTRAVENOUS
  Filled 2017-11-18: qty 2

## 2017-11-18 MED ORDER — PROPOFOL 500 MG/50ML IV EMUL
INTRAVENOUS | Status: DC | PRN
  Administered 2017-11-18: 50 ug/kg/min via INTRAVENOUS

## 2017-11-18 MED ORDER — SODIUM CHLORIDE 0.9 % IR SOLN
Status: DC | PRN
  Administered 2017-11-18: 3000 mL

## 2017-11-18 MED ORDER — METOCLOPRAMIDE HCL 5 MG PO TABS: 5.0000 mg | ORAL_TABLET | Freq: Three times a day (TID) | ORAL | Status: DC | PRN

## 2017-11-18 MED ORDER — SODIUM CHLORIDE 0.9 % IJ SOLN
INTRAMUSCULAR | Status: DC | PRN
  Administered 2017-11-18: 20 mL

## 2017-11-18 MED ORDER — PROPOFOL 10 MG/ML IV BOLUS
INTRAVENOUS | Status: AC
  Filled 2017-11-18: qty 40

## 2017-11-18 MED ORDER — LISINOPRIL-HYDROCHLOROTHIAZIDE 10-12.5 MG PO TABS: 1.0000 | ORAL_TABLET | Freq: Every day | ORAL | Status: DC

## 2017-11-18 MED ORDER — OXYCODONE HCL 5 MG PO TABS
5.0000 mg | ORAL_TABLET | ORAL | Status: DC | PRN
  Administered 2017-11-19 – 2017-11-20 (×4): 10 mg via ORAL
  Filled 2017-11-18 (×4): qty 2

## 2017-11-18 MED ORDER — HYDROMORPHONE HCL 1 MG/ML IJ SOLN
0.2500 mg | INTRAMUSCULAR | Status: DC | PRN
  Administered 2017-11-18: 0.25 mg via INTRAVENOUS

## 2017-11-18 MED ORDER — PHENYLEPHRINE 40 MCG/ML (10ML) SYRINGE FOR IV PUSH (FOR BLOOD PRESSURE SUPPORT)
PREFILLED_SYRINGE | INTRAVENOUS | Status: DC | PRN
  Administered 2017-11-18: 120 ug via INTRAVENOUS

## 2017-11-18 MED ORDER — CEFAZOLIN SODIUM-DEXTROSE 2-4 GM/100ML-% IV SOLN
2.0000 g | Freq: Four times a day (QID) | INTRAVENOUS | Status: AC
  Administered 2017-11-18 (×2): 2 g via INTRAVENOUS
  Filled 2017-11-18 (×2): qty 100

## 2017-11-18 MED ORDER — ALUM & MAG HYDROXIDE-SIMETH 200-200-20 MG/5ML PO SUSP: 30.0000 mL | ORAL | Status: DC | PRN

## 2017-11-18 MED ORDER — PHENOL 1.4 % MT LIQD: 1.0000 | OROMUCOSAL | Status: DC | PRN

## 2017-11-18 MED ORDER — ATORVASTATIN CALCIUM 20 MG PO TABS
20.0000 mg | ORAL_TABLET | Freq: Every day | ORAL | Status: DC
  Administered 2017-11-19 – 2017-11-20 (×2): 20 mg via ORAL
  Filled 2017-11-18 (×3): qty 1

## 2017-11-18 MED ORDER — HYDROMORPHONE HCL 1 MG/ML IJ SOLN
INTRAMUSCULAR | Status: AC
  Filled 2017-11-18: qty 1

## 2017-11-18 MED ORDER — PROPOFOL 10 MG/ML IV BOLUS
INTRAVENOUS | Status: AC
  Filled 2017-11-18: qty 20

## 2017-11-18 MED ORDER — DEXAMETHASONE SODIUM PHOSPHATE 10 MG/ML IJ SOLN
INTRAMUSCULAR | Status: DC | PRN
  Administered 2017-11-18: 10 mg via INTRAVENOUS

## 2017-11-18 MED ORDER — METOCLOPRAMIDE HCL 5 MG/ML IJ SOLN: 5.0000 mg | Freq: Three times a day (TID) | INTRAMUSCULAR | Status: DC | PRN

## 2017-11-18 MED ORDER — LISINOPRIL 10 MG PO TABS
10.0000 mg | ORAL_TABLET | Freq: Every day | ORAL | Status: DC
  Administered 2017-11-19 – 2017-11-20 (×2): 10 mg via ORAL
  Filled 2017-11-18 (×3): qty 1

## 2017-11-18 MED ORDER — PROPOFOL 10 MG/ML IV BOLUS
INTRAVENOUS | Status: DC | PRN
  Administered 2017-11-18: 20 mg via INTRAVENOUS
  Administered 2017-11-18: 30 mg via INTRAVENOUS
  Administered 2017-11-18 (×3): 20 mg via INTRAVENOUS
  Administered 2017-11-18: 150 mg via INTRAVENOUS

## 2017-11-18 MED ORDER — LACTATED RINGERS IV SOLN
INTRAVENOUS | Status: DC
  Administered 2017-11-18 (×3): via INTRAVENOUS

## 2017-11-18 MED ORDER — MIDAZOLAM HCL 2 MG/2ML IJ SOLN
1.0000 mg | INTRAMUSCULAR | Status: DC
  Administered 2017-11-18: 2 mg via INTRAVENOUS
  Filled 2017-11-18: qty 2

## 2017-11-18 MED ORDER — STERILE WATER FOR IRRIGATION IR SOLN
Status: DC | PRN
  Administered 2017-11-18: 2000 mL

## 2017-11-18 MED ORDER — CHLORHEXIDINE GLUCONATE 4 % EX LIQD: 60.0000 mL | Freq: Once | CUTANEOUS | Status: DC

## 2017-11-18 MED ORDER — DOCUSATE SODIUM 100 MG PO CAPS
100.0000 mg | ORAL_CAPSULE | Freq: Two times a day (BID) | ORAL | Status: DC
  Administered 2017-11-18 – 2017-11-20 (×4): 100 mg via ORAL
  Filled 2017-11-18 (×5): qty 1

## 2017-11-18 MED ORDER — METHOCARBAMOL 500 MG PO TABS
500.0000 mg | ORAL_TABLET | Freq: Four times a day (QID) | ORAL | Status: DC | PRN
  Administered 2017-11-19 – 2017-11-20 (×3): 500 mg via ORAL
  Filled 2017-11-18 (×4): qty 1

## 2017-11-18 MED ORDER — BUPIVACAINE LIPOSOME 1.3 % IJ SUSP
20.0000 mL | Freq: Once | INTRAMUSCULAR | Status: AC
  Administered 2017-11-18: 20 mL
  Filled 2017-11-18: qty 20

## 2017-11-18 MED ORDER — MIDAZOLAM HCL 2 MG/2ML IJ SOLN
INTRAMUSCULAR | Status: DC | PRN
  Administered 2017-11-18 (×2): 1 mg via INTRAVENOUS

## 2017-11-18 MED ORDER — POLYETHYLENE GLYCOL 3350 17 G PO PACK: 17.0000 g | PACK | Freq: Every day | ORAL | Status: DC | PRN

## 2017-11-18 MED ORDER — FENTANYL CITRATE (PF) 250 MCG/5ML IJ SOLN
INTRAMUSCULAR | Status: AC
  Filled 2017-11-18: qty 5

## 2017-11-18 MED ORDER — BUPIVACAINE IN DEXTROSE 0.75-8.25 % IT SOLN
INTRATHECAL | Status: DC | PRN
  Administered 2017-11-18: 2 mL via INTRATHECAL

## 2017-11-18 MED ORDER — METHOCARBAMOL 1000 MG/10ML IJ SOLN
500.0000 mg | Freq: Four times a day (QID) | INTRAVENOUS | Status: DC | PRN
  Administered 2017-11-18: 500 mg via INTRAVENOUS
  Filled 2017-11-18: qty 550

## 2017-11-18 MED ORDER — HYDROMORPHONE HCL 1 MG/ML IJ SOLN: 0.5000 mg | INTRAMUSCULAR | Status: DC | PRN

## 2017-11-18 MED ORDER — SODIUM CHLORIDE 0.9 % IR SOLN
Status: DC | PRN
  Administered 2017-11-18: 1000 mL

## 2017-11-18 MED ORDER — MENTHOL 3 MG MT LOZG: 1.0000 | LOZENGE | OROMUCOSAL | Status: DC | PRN

## 2017-11-18 SURGICAL SUPPLY — 59 items
BAG ZIPLOCK 12X15 (MISCELLANEOUS) IMPLANT
BANDAGE ACE 6X5 VEL STRL LF (GAUZE/BANDAGES/DRESSINGS) ×6 IMPLANT
BEARIN INSERT TIBIAL TRIA #5 (Orthopedic Implant) ×3 IMPLANT
BEARING INSERT TIBIAL TRIA #5 (Orthopedic Implant) ×1 IMPLANT
BENZOIN TINCTURE PRP APPL 2/3 (GAUZE/BANDAGES/DRESSINGS) IMPLANT
BLADE SAG 18X100X1.27 (BLADE) ×3 IMPLANT
BOWL SMART MIX CTS (DISPOSABLE) IMPLANT
CLOSURE WOUND 1/2 X4 (GAUZE/BANDAGES/DRESSINGS)
COVER SURGICAL LIGHT HANDLE (MISCELLANEOUS) ×3 IMPLANT
CUFF TOURN SGL QUICK 34 (TOURNIQUET CUFF) ×2
CUFF TRNQT CYL 34X4X40X1 (TOURNIQUET CUFF) ×1 IMPLANT
DRAPE TOP 10253 STERILE (DRAPES) IMPLANT
DRAPE U-SHAPE 47X51 STRL (DRAPES) ×3 IMPLANT
DRSG PAD ABDOMINAL 8X10 ST (GAUZE/BANDAGES/DRESSINGS) ×3 IMPLANT
DURAPREP 26ML APPLICATOR (WOUND CARE) ×3 IMPLANT
ELECT REM PT RETURN 15FT ADLT (MISCELLANEOUS) ×3 IMPLANT
FEMORAL POSTERIOR SZ5 RT (Femur) ×1 IMPLANT
GAUZE SPONGE 4X4 12PLY STRL (GAUZE/BANDAGES/DRESSINGS) ×3 IMPLANT
GAUZE XEROFORM 1X8 LF (GAUZE/BANDAGES/DRESSINGS) ×3 IMPLANT
GLOVE BIO SURGEON STRL SZ7.5 (GLOVE) ×3 IMPLANT
GLOVE BIOGEL PI IND STRL 6.5 (GLOVE) ×1 IMPLANT
GLOVE BIOGEL PI IND STRL 7.5 (GLOVE) ×2 IMPLANT
GLOVE BIOGEL PI IND STRL 8 (GLOVE) ×2 IMPLANT
GLOVE BIOGEL PI INDICATOR 6.5 (GLOVE) ×2
GLOVE BIOGEL PI INDICATOR 7.5 (GLOVE) ×4
GLOVE BIOGEL PI INDICATOR 8 (GLOVE) ×4
GLOVE ECLIPSE 8.0 STRL XLNG CF (GLOVE) ×3 IMPLANT
GLOVE ORTHO TXT STRL SZ7.5 (GLOVE) ×3 IMPLANT
GLOVE SURG SS PI 7.5 STRL IVOR (GLOVE) ×3 IMPLANT
GOWN SPEC L3 XXLG W/TWL (GOWN DISPOSABLE) ×3 IMPLANT
GOWN STRL REUS W/TWL XL LVL3 (GOWN DISPOSABLE) ×9 IMPLANT
HANDPIECE INTERPULSE COAX TIP (DISPOSABLE) ×2
IMMOBILIZER KNEE 20 (SOFTGOODS) ×3
IMMOBILIZER KNEE 20 THIGH 36 (SOFTGOODS) ×1 IMPLANT
IMMOBILIZER KNEE 22 (SOFTGOODS) ×3 IMPLANT
KNEE TIBIAL COMPONENT SZ5 (Knees) ×3 IMPLANT
NS IRRIG 1000ML POUR BTL (IV SOLUTION) ×3 IMPLANT
PACK TOTAL KNEE CUSTOM (KITS) ×3 IMPLANT
PAD ABD 8X10 STRL (GAUZE/BANDAGES/DRESSINGS) ×3 IMPLANT
PADDING CAST COTTON 6X4 STRL (CAST SUPPLIES) ×6 IMPLANT
PATELLA ASYMMETRIC 38X11 KNEE (Orthopedic Implant) ×3 IMPLANT
POSITIONER SURGICAL ARM (MISCELLANEOUS) ×3 IMPLANT
POSTERIOR FEMORAL SZ5 RT (Femur) ×3 IMPLANT
SET HNDPC FAN SPRY TIP SCT (DISPOSABLE) ×1 IMPLANT
SET PAD KNEE POSITIONER (MISCELLANEOUS) ×3 IMPLANT
STAPLER VISISTAT 35W (STAPLE) ×3 IMPLANT
STRIP CLOSURE SKIN 1/2X4 (GAUZE/BANDAGES/DRESSINGS) IMPLANT
SUT MNCRL AB 4-0 PS2 18 (SUTURE) IMPLANT
SUT VIC AB 0 CT1 27 (SUTURE) ×2
SUT VIC AB 0 CT1 27XBRD ANTBC (SUTURE) ×1 IMPLANT
SUT VIC AB 0 CT1 36 (SUTURE) ×3 IMPLANT
SUT VIC AB 1 CT1 27 (SUTURE) ×4
SUT VIC AB 1 CT1 27XBRD ANTBC (SUTURE) ×2 IMPLANT
SUT VIC AB 2-0 CT1 27 (SUTURE) ×6
SUT VIC AB 2-0 CT1 TAPERPNT 27 (SUTURE) ×3 IMPLANT
TRAY FOLEY W/METER SILVER 16FR (SET/KITS/TRAYS/PACK) ×3 IMPLANT
WATER STERILE IRR 1000ML POUR (IV SOLUTION) ×3 IMPLANT
WRAP KNEE MAXI GEL POST OP (GAUZE/BANDAGES/DRESSINGS) ×3 IMPLANT
YANKAUER SUCT BULB TIP 10FT TU (MISCELLANEOUS) ×3 IMPLANT

## 2017-11-18 NOTE — Progress Notes (Signed)
PT Cancellation Note  Patient Details Name: Shivaay Mcneill MRN: [id number] [id number] DOB: 16-May-1946   Cancelled Treatment:    Reason Eval/Treat Not Completed: Pain limiting ability to participate(pt hasn't had pain medication yet, per RN, waiting for pharmacy to release the medication. Will follow. )   Tamala SerUhlenberg, Lucill Mauck Kistler 11/18/2017, 1:55 PM [phone]

## 2017-11-18 NOTE — Anesthesia Procedure Notes (Signed)
Procedure Name: LMA Insertion Date/Time: 11/18/2017 9:22 AM Performed by: Minerva EndsMirarchi, Brinklee Cisse M, CRNA Pre-anesthesia Checklist: Patient identified, Emergency Drugs available, Suction available and Patient being monitored Patient Re-evaluated:Patient Re-evaluated prior to induction Oxygen Delivery Method: Circle System Utilized Preoxygenation: Pre-oxygenation with 100% oxygen Induction Type: IV induction Ventilation: Mask ventilation without difficulty LMA: LMA inserted LMA Size: 4.0 Number of attempts: 1 Placement Confirmation: positive ETCO2 (20 cc of air in LMA) Tube secured with: Tape Dental Injury: Teeth and Oropharynx as per pre-operative assessment  Comments: Surgeon complains of pt movement-- ? "patchy" spinal--- pt sedated and Dr Chilton SiGreen notified-- IV induction Green-- LMA AM CRNA atraumatic-- teeth and mouth as preop-- small chips front teeth-- bilat BS Green

## 2017-11-18 NOTE — Transfer of Care (Signed)
Immediate Anesthesia Transfer of Care Note  Patient: Kyle Meyers  Procedure(s) Performed: RIGHT TOTAL KNEE ARTHROPLASTY (Right Knee)  Patient Location: PACU  Anesthesia Type:General  Level of Consciousness: awake, alert , oriented and patient cooperative  Airway & Oxygen Therapy: Patient Spontanous Breathing and Patient connected to face mask oxygen  Post-op Assessment: Report given to RN and Post -op Vital signs reviewed and stable  Post vital signs: Reviewed and stable  Last Vitals:  Vitals Value Taken Time  BP 126/80 11/18/2017 10:53 AM  Temp    Pulse 66 11/18/2017 10:54 AM  Resp 22 11/18/2017 10:54 AM  SpO2 100 % 11/18/2017 10:54 AM  Vitals shown include unvalidated device data.  Last Pain:  Vitals:   11/18/17 0657  TempSrc: Oral         Complications: No apparent anesthesia complications

## 2017-11-18 NOTE — Anesthesia Postprocedure Evaluation (Signed)
Anesthesia Post Note  Patient: Kyle Meyers  Procedure(s) Performed: RIGHT TOTAL KNEE ARTHROPLASTY (Right Knee)     Patient location during evaluation: PACU Anesthesia Type: General Level of consciousness: awake Pain management: pain level controlled Vital Signs Assessment: post-procedure vital signs reviewed and stable Respiratory status: spontaneous breathing Cardiovascular status: stable Anesthetic complications: no    Last Vitals:  Vitals:   11/18/17 1302 11/18/17 1406  BP: 124/75 125/78  Pulse: 67 70  Resp: 16 12  Temp: 36.5 C 36.4 C  SpO2: 100% 100%    Last Pain:  Vitals:   11/18/17 1406  TempSrc: Oral  PainSc:                  Kyle Meyers

## 2017-11-18 NOTE — Addendum Note (Signed)
Addendum  created 11/18/17 1539 by Dorris SinghGreen, Keymon Mcelroy, MD   Child order released for a procedure order, Image imported, Intraprocedure Blocks edited, Sign clinical note

## 2017-11-18 NOTE — Anesthesia Procedure Notes (Signed)
Spinal  Start time: 11/18/2017 8:40 AM End time: 11/18/2017 8:55 AM Staffing Anesthesiologist: Dorris SinghGreen, Alida Greiner, MD Resident/CRNA: Minerva EndsMirarchi, Angela M, CRNA Performed: anesthesiologist and resident/CRNA  Preanesthetic Checklist Completed: patient identified, site marked, surgical consent, pre-op evaluation, timeout performed, IV checked, risks and benefits discussed and monitors and equipment checked Spinal Block Patient position: sitting Prep: DuraPrep Patient monitoring: heart rate, cardiac monitor, continuous pulse ox and blood pressure Approach: right paramedian Location: L3-4 Injection technique: single-shot Needle Needle gauge: 22 G Assessment Sensory level: T10 Additional Notes Clear CSF 2cc spinal marcaine. Patient tolerated procedure. Initial attempts unsuccessful by CRNA Mirachi. Patient did well.

## 2017-11-18 NOTE — Plan of Care (Signed)
Reviewed plan of care with pt and spouse, specifically pain control, safety, and importance of notifying RN with any questions or concerns. Both attentive and verbalized understanding.

## 2017-11-18 NOTE — Anesthesia Procedure Notes (Signed)
Anesthesia Procedure Image    

## 2017-11-18 NOTE — Brief Op Note (Signed)
11/18/2017  10:25 AM  PATIENT:  Weslee Bechler  72 y.o. male  PRE-OPERATIVE DIAGNOSIS:  osteoarthritis right knee  POST-OPERATIVE DIAGNOSIS:  osteoarthritis right knee  PROCEDURE:  Procedure(s): RIGHT TOTAL KNEE ARTHROPLASTY (Right)  SURGEON:  Surgeon(s) and Role:    Kathryne Hitch* Tehani Mersman Y, MD - Primary  PHYSICIAN ASSISTANT: Rexene EdisonGil Clark, PA-C  ANESTHESIA:   local, regional, spinal and general  EBL:  50 mL   COUNTS:  YES  TOURNIQUET:   Total Tourniquet Time Documented: Thigh (Right) - 52 minutes Total: Thigh (Right) - 52 minutes   DICTATION: .Other Dictation: Dictation Number [id number]  PLAN OF CARE: Admit to inpatient   PATIENT DISPOSITION:  PACU - hemodynamically stable.   Delay start of Pharmacological VTE agent (>24hrs) due to surgical blood loss or risk of bleeding: no

## 2017-11-18 NOTE — Anesthesia Procedure Notes (Signed)
Anesthesia Regional Block: Adductor canal block   Pre-Anesthetic Checklist: ,, timeout performed, Correct Patient, Correct Site, Correct Laterality, Correct Procedure, Correct Position, site marked, Risks and benefits discussed,  Surgical consent,  Pre-op evaluation,  At surgeon's request and post-op pain management  Laterality: Right  Prep: chloraprep       Needles:  Injection technique: Single-shot  Needle Type: Stimulator Needle - 80          Additional Needles:   Procedures: Doppler guided,,,, ultrasound used (permanent image in chart),,,,  Narrative:  Start time: 11/18/2017 7:50 AM End time: 11/18/2017 8:05 AM Injection made incrementally with aspirations every 5 mL.

## 2017-11-18 NOTE — Progress Notes (Signed)
  AssistedDr. Randa EvensEdwards with right, ultrasound guided, knee, adductor canal block. Side rails up, monitors on throughout procedure. See vital signs in flow sheet. Tolerated Procedure well.

## 2017-11-18 NOTE — Anesthesia Procedure Notes (Signed)
Date/Time: 11/18/2017 8:36 AM Performed by: Minerva EndsMirarchi, Joeseph Verville M, CRNA Pre-anesthesia Checklist: Patient identified, Emergency Drugs available, Suction available, Patient being monitored and Timeout performed Patient Re-evaluated:Patient Re-evaluated prior to induction Oxygen Delivery Method: Simple face mask Placement Confirmation: positive ETCO2 and breath sounds checked- equal and bilateral Dental Injury: Teeth and Oropharynx as per pre-operative assessment

## 2017-11-18 NOTE — Anesthesia Procedure Notes (Signed)
Spinal  Patient location during procedure: OR Staffing Anesthesiologist: Dorris SinghGreen, Charlene, MD Resident/CRNA: Minerva EndsMirarchi, Samit Sylve M, CRNA Performed: anesthesiologist  Preanesthetic Checklist Completed: patient identified, site marked, surgical consent, pre-op evaluation, timeout performed, IV checked, risks and benefits discussed and monitors and equipment checked Spinal Block Patient position: sitting Prep: ChloraPrep and site prepped and draped Patient monitoring: heart rate, continuous pulse ox, blood pressure and cardiac monitor Approach: midline Location: L3-4 Injection technique: single-shot Needle Needle type: Sprotte and Quincke  Needle gauge: 22 G Assessment Sensory level: T6 Additional Notes Expiration date of tray noted and within date.   Patient tolerated procedure well. CRNA Ramatoulaye Pack -- attempted spinal x1-- unsucessful--   Green MDA performed spinal -- prep site dry before skin puncture

## 2017-11-18 NOTE — Anesthesia Preprocedure Evaluation (Addendum)
Anesthesia Evaluation  Patient identified by MRN, date of birth, ID band Patient awake    Reviewed: Allergy & Precautions, NPO status , Patient's Chart, lab work & pertinent test results  History of Anesthesia Complications (+) PONV  Airway Mallampati: II  TM Distance: >3 FB     Dental  (+) Dental Advisory Given, Chipped   Pulmonary    breath sounds clear to auscultation       Cardiovascular hypertension,  Rhythm:Regular Rate:Normal     Neuro/Psych    GI/Hepatic Neg liver ROS, GERD  ,  Endo/Other  negative endocrine ROS  Renal/GU negative Renal ROS     Musculoskeletal  (+) Arthritis ,   Abdominal   Peds  Hematology   Anesthesia Other Findings   Reproductive/Obstetrics                           Anesthesia Physical Anesthesia Plan  ASA: III  Anesthesia Plan: Spinal and General   Post-op Pain Management:    Induction: Intravenous  PONV Risk Score and Plan: Treatment may vary due to age or medical condition, Ondansetron, Dexamethasone and Midazolam  Airway Management Planned: Simple Face Mask and Nasal Cannula  Additional Equipment:   Intra-op Plan:   Post-operative Plan:   Informed Consent: I have reviewed the patients History and Physical, chart, labs and discussed the procedure including the risks, benefits and alternatives for the proposed anesthesia with the patient or authorized representative who has indicated his/her understanding and acceptance.   Dental advisory given  Plan Discussed with: CRNA and Anesthesiologist  Anesthesia Plan Comments:       Anesthesia Quick Evaluation

## 2017-11-18 NOTE — H&P (Signed)
TOTAL KNEE ADMISSION H&P  Patient is being admitted for right total knee arthroplasty.  Subjective:  Chief Complaint:right knee pain.  HPI: Kyle MarshallJimmy Hammonds, 72 y.o. male, has a history of pain and functional disability in the right knee due to arthritis and has failed non-surgical conservative treatments for greater than 12 weeks to includeNSAID's and/or analgesics, corticosteriod injections, viscosupplementation injections, flexibility and strengthening excercises, use of assistive devices, weight reduction as appropriate and activity modification.  Onset of symptoms was gradual, starting 3 years ago with gradually worsening course since that time. The patient noted prior procedures on the knee to include  arthroscopy on the right knee(s).  Patient currently rates pain in the right knee(s) at 10 out of 10 with activity. Patient has night pain, worsening of pain with activity and weight bearing, pain that interferes with activities of daily living, pain with passive range of motion, crepitus and joint swelling.  Patient has evidence of subchondral sclerosis, periarticular osteophytes and joint space narrowing by imaging studies. There is no active infection.  Patient Active Problem List   Diagnosis Date Noted  . Chronic pain of right knee 03/30/2017  . Unilateral primary osteoarthritis, right knee 03/30/2017   Past Medical History:  Diagnosis Date  . Complication of anesthesia   . GERD (gastroesophageal reflux disease)   . History of colon polyps   . Hypercholesteremia   . Hypertension   . OA (osteoarthritis)   . Obesity   . PONV (postoperative nausea and vomiting)    Vomiting only-Pt also reported excessive gas  . Rectal bleed 09/2017  . Reflux   . Ureteral stone     Past Surgical History:  Procedure Laterality Date  . APPENDECTOMY    . BILATERAL KNEE ARTHROSCOPY    . COLONOSCOPY  09/2017  . ESOPHAGOGASTRODUODENOSCOPY    . MOUTH SURGERY    . PALATE / UVULA BIOPSY / EXCISION     . TONSILLECTOMY      Current Facility-Administered Medications  Medication Dose Route Frequency Provider Last Rate Last Dose  . ceFAZolin (ANCEF) 3 g in dextrose 5 % 50 mL IVPB  3 g Intravenous On Call to OR Kathryne HitchBlackman, Dodger Sinning Y, MD      . chlorhexidine (HIBICLENS) 4 % liquid 4 application  60 mL Topical Once Richardean Canallark, Gilbert W, PA-C      . fentaNYL (SUBLIMAZE) injection 50-100 mcg  50-100 mcg Intravenous UD Dorris SinghGreen, Charlene, MD      . lactated ringers infusion   Intravenous Continuous Dorris SinghGreen, Charlene, MD      . midazolam (VERSED) injection 1-2 mg  1-2 mg Intravenous UD Dorris SinghGreen, Charlene, MD      . tranexamic acid (CYKLOKAPRON) 1,000 mg in sodium chloride 0.9 % 100 mL IVPB  1,000 mg Intravenous On Call to OR Kathryne HitchBlackman, Mekhi Sonn Y, MD       No Known Allergies  Social History   Tobacco Use  . Smoking status: Never Smoker  . Smokeless tobacco: Never Used  Substance Use Topics  . Alcohol use: Yes    Comment: social    History reviewed. No pertinent family history.   Review of Systems  Musculoskeletal: Positive for joint pain.  All other systems reviewed and are negative.   Objective:  Physical Exam  Constitutional: He is oriented to person, place, and time. He appears well-developed and well-nourished.  HENT:  Head: Normocephalic and atraumatic.  Eyes: Pupils are equal, round, and reactive to light. EOM are normal.  Neck: Normal range of motion. Neck supple.  Cardiovascular: Normal rate and regular rhythm.  Respiratory: Effort normal and breath sounds normal.  GI: Soft. Bowel sounds are normal.  Musculoskeletal:       Right knee: He exhibits decreased range of motion, effusion and abnormal alignment. Tenderness found. Medial joint line and lateral joint line tenderness noted.  Neurological: He is alert and oriented to person, place, and time.  Skin: Skin is warm and dry.  Psychiatric: He has a normal mood and affect.    Vital signs in last 24 hours: Temp:  [98.5 F (36.9  C)] 98.5 F (36.9 C) (04/12 0657) Pulse Rate:  [92] 92 (04/12 0657) Resp:  [18] 18 (04/12 0657) BP: (146)/(91) 146/91 (04/12 0657) SpO2:  [99 %] 99 % (04/12 0657) Weight:  [284 lb (128.8 kg)] 284 lb (128.8 kg) (04/12 0703)  Labs:   Estimated body mass index is 39.61 kg/m as calculated from the following:   Height as of this encounter: 5\' 11"  (1.803 m).   Weight as of this encounter: 284 lb (128.8 kg).   Imaging Review Plain radiographs demonstrate severe degenerative joint disease of the right knee(s). The overall alignment ismild varus. The bone quality appears to be good for age and reported activity level.   Preoperative templating of the joint replacement has been completed, documented, and submitted to the Operating Room personnel in order to optimize intra-operative equipment management.   Anticipated LOS equal to or greater than 2 midnights due to - Age 59 and older with one or more of the following:  - Obesity  - Expected need for hospital services (PT, OT, Nursing) required for safe  discharge  - Anticipated need for postoperative skilled nursing care or inpatient rehab  - Active co-morbidities: Morbid obesity OR   - Unanticipated findings during/Post Surgery: Slow post-op progression: GI, pain control, mobility and past problems with anesthesia  - Patient is a high risk of re-admission due to: None     Assessment/Plan:  End stage arthritis, right knee   The patient history, physical examination, clinical judgment of the provider and imaging studies are consistent with end stage degenerative joint disease of the right knee(s) and total knee arthroplasty is deemed medically necessary. The treatment options including medical management, injection therapy arthroscopy and arthroplasty were discussed at length. The risks and benefits of total knee arthroplasty were presented and reviewed. The risks due to aseptic loosening, infection, stiffness, patella tracking  problems, thromboembolic complications and other imponderables were discussed. The patient acknowledged the explanation, agreed to proceed with the plan and consent was signed. Patient is being admitted for inpatient treatment for surgery, pain control, PT, OT, prophylactic antibiotics, VTE prophylaxis, progressive ambulation and ADL's and discharge planning. The patient is planning to be discharged home with home health services

## 2017-11-19 DIAGNOSIS — E7800 Pure hypercholesterolemia, unspecified: Secondary | ICD-10-CM | POA: Diagnosis present

## 2017-11-19 DIAGNOSIS — G8929 Other chronic pain: Secondary | ICD-10-CM | POA: Diagnosis present

## 2017-11-19 DIAGNOSIS — I10 Essential (primary) hypertension: Secondary | ICD-10-CM | POA: Diagnosis present

## 2017-11-19 DIAGNOSIS — K219 Gastro-esophageal reflux disease without esophagitis: Secondary | ICD-10-CM | POA: Diagnosis present

## 2017-11-19 DIAGNOSIS — Z6839 Body mass index (BMI) 39.0-39.9, adult: Secondary | ICD-10-CM | POA: Diagnosis not present

## 2017-11-19 DIAGNOSIS — M1711 Unilateral primary osteoarthritis, right knee: Secondary | ICD-10-CM | POA: Diagnosis present

## 2017-11-19 LAB — BASIC METABOLIC PANEL
Anion gap: 11 (ref 5–15)
BUN: 23 mg/dL — ABNORMAL HIGH (ref 6–20)
CALCIUM: 8.1 mg/dL — AB (ref 8.9–10.3)
CO2: 23 mmol/L (ref 22–32)
CREATININE: 0.89 mg/dL (ref 0.61–1.24)
Chloride: 102 mmol/L (ref 101–111)
Glucose, Bld: 123 mg/dL — ABNORMAL HIGH (ref 65–99)
Potassium: 4.2 mmol/L (ref 3.5–5.1)
SODIUM: 136 mmol/L (ref 135–145)

## 2017-11-19 LAB — CBC
HEMATOCRIT: 41.8 % (ref 39.0–52.0)
Hemoglobin: 13.7 g/dL (ref 13.0–17.0)
MCH: 28.5 pg (ref 26.0–34.0)
MCHC: 32.8 g/dL (ref 30.0–36.0)
MCV: 87.1 fL (ref 78.0–100.0)
PLATELETS: 188 10*3/uL (ref 150–400)
RBC: 4.8 MIL/uL (ref 4.22–5.81)
RDW: 14.2 % (ref 11.5–15.5)
WBC: 13.1 10*3/uL — AB (ref 4.0–10.5)

## 2017-11-19 MED ORDER — METHOCARBAMOL 500 MG PO TABS: 500.0000 mg | ORAL_TABLET | Freq: Four times a day (QID) | ORAL | 0 refills | Status: AC | PRN

## 2017-11-19 MED ORDER — OXYCODONE HCL 5 MG PO TABS: 5.0000 mg | ORAL_TABLET | ORAL | 0 refills | Status: AC | PRN

## 2017-11-19 MED ORDER — RIVAROXABAN 10 MG PO TABS: 10.0000 mg | ORAL_TABLET | Freq: Every day | ORAL | 0 refills | Status: AC

## 2017-11-19 NOTE — Progress Notes (Signed)
Physical Therapy Treatment Patient Details Name: Kyle Meyers MRN: [id number] DOB: [birth year] Today's Date: 11/19/2017    History of Present Illness s/p R TKA    PT Comments    Pt progressing well, pain better controlled this pm; pt reports he hopes to d/c tomorrow if possible   Follow Up Recommendations  Home health PT;Follow surgeon's recommendation for DC plan and follow-up therapies     Equipment Recommendations  Rolling walker with 5" wheels    Recommendations for Other Services       Precautions / Restrictions Precautions Precautions: Knee;Fall Precaution Comments: reviewed precautions Required Braces or Orthoses: Knee Immobilizer - Right Knee Immobilizer - Right: Discontinue once straight leg raise with < 10 degree lag Restrictions Weight Bearing Restrictions: No Other Position/Activity Restrictions: WBAT    Mobility  Bed Mobility Overal bed mobility: Needs Assistance Bed Mobility: Supine to Sit;Sit to Supine     Supine to sit: Min assist Sit to supine: Min assist   General bed mobility comments: assist with RLE  Transfers Overall transfer level: Needs assistance Equipment used: Rolling walker (2 wheeled) Transfers: Sit to/from Stand Sit to Stand: Min assist;Min guard         General transfer comment: cues for hand placement and RLE position  Ambulation/Gait Ambulation/Gait assistance: Min guard;Min assist Ambulation Distance (Feet): 60 Feet Assistive device: Rolling walker (2 wheeled) Gait Pattern/deviations: Step-to pattern;Antalgic     General Gait Details: cues for sequence and RW position   Stairs             Wheelchair Mobility    Modified Rankin (Stroke Patients Only)       Balance                                            Cognition Arousal/Alertness: Awake/alert Behavior During Therapy: WFL for tasks assessed/performed Overall Cognitive Status: Within Functional Limits for tasks assessed                                        Exercises Total Joint Exercises Ankle Circles/Pumps: AROM;Both;10 reps Quad Sets: AROM;Both;10 reps Heel Slides: AAROM;Right;10 reps Hip ABduction/ADduction: AAROM;Right;10 reps Straight Leg Raises: AAROM;Right;10 reps Goniometric ROM: grossly 10* to 60*    General Comments        Pertinent Vitals/Pain Pain Assessment: 0-10 Pain Score: 6  Pain Location: right knee and thigh Pain Descriptors / Indicators: Aching;Sore;Grimacing;Guarding Pain Intervention(s): Limited activity within patient's tolerance;Monitored during session;Premedicated before session;Repositioned    Home Living                      Prior Function            PT Goals (current goals can now be found in the care plan section) Acute Rehab PT Goals Patient Stated Goal:  get back to woodworking PT Goal Formulation: With patient Time For Goal Achievement: 12/03/17 Potential to Achieve Goals: Good Progress towards PT goals: Progressing toward goals    Frequency    7X/week      PT Plan Current plan remains appropriate    Co-evaluation              AM-PAC PT "6 Clicks" Daily Activity  Outcome Measure  Difficulty turning over in bed (including adjusting bedclothes, sheets and  blankets)?: Unable Difficulty moving from lying on back to sitting on the side of the bed? : Unable Difficulty sitting down on and standing up from a chair with arms (e.g., wheelchair, bedside commode, etc,.)?: Unable Help needed moving to and from a bed to chair (including a wheelchair)?: A Little Help needed walking in hospital room?: A Little Help needed climbing 3-5 steps with a railing? : A Lot 6 Click Score: 11    End of Session Equipment Utilized During Treatment: Gait belt;Right knee immobilizer Activity Tolerance: Patient tolerated treatment well Patient left: in bed;with call bell/phone within reach;with bed alarm set   PT Visit Diagnosis: Difficulty  in walking, not elsewhere classified (R26.2);Pain Pain - Right/Left: Right Pain - part of body: Knee     Time: 9147-8295 PT Time Calculation (min) (ACUTE ONLY): 18 min  Charges:  $Gait Training: 8-22 mins                    G CodesDrucilla Chalet, PT Pager: [phone] 11/19/2017    Drucilla Chalet 11/19/2017, 4:13 PM

## 2017-11-19 NOTE — Evaluation (Signed)
Physical Therapy Evaluation Patient Details Name: Kyle Meyers MRN: [id number] DOB: Aug 17, 1945 Today's Date: 11/19/2017   History of Present Illness  s/p R TKA  Clinical Impression  Pt is s/p TKA resulting in the deficits listed below (see PT Problem List).  Mobility limited by pain this am (8-9/10); will continue to follow   Pt will benefit from skilled PT to increase their independence and safety with mobility to allow discharge to the venue listed below.      Follow Up Recommendations Home health PT;Follow surgeon's recommendation for DC plan and follow-up therapies    Equipment Recommendations  (has standard walker)    Recommendations for Other Services       Precautions / Restrictions Precautions Precautions: Knee;Fall Precaution Comments: reviewed precautions, pt reports difficulty with terminal knee extension prior to surgery which my now be contributing to his elevated level of post op pain in addition to not having had his oxy IR Required Braces or Orthoses: Knee Immobilizer - Right Knee Immobilizer - Right: Discontinue once straight leg raise with < 10 degree lag Restrictions Weight Bearing Restrictions: No Other Position/Activity Restrictions: WBAT      Mobility  Bed Mobility Overal bed mobility: Needs Assistance Bed Mobility: Sit to Supine       Sit to supine: Min assist   General bed mobility comments: assist with RLE  Transfers Overall transfer level: Needs assistance Equipment used: Rolling walker (2 wheeled) Transfers: Sit to/from UGI CorporationStand;Stand Pivot Transfers Sit to Stand: Min assist Stand pivot transfers: Min assist       General transfer comment: steadying assistance; cues for UE/LE placement, pt is mildy impulsive (likely d/t elevated pain)  Ambulation/Gait Ambulation/Gait assistance: Min assist Ambulation Distance (Feet): 6 Feet Assistive device: Rolling walker (2 wheeled) Gait Pattern/deviations: Step-to pattern;Antalgic     General  Gait Details: cues for sequence and RW safety, steps forward and back to bed, limited by pain  Stairs            Wheelchair Mobility    Modified Rankin (Stroke Patients Only)       Balance                                             Pertinent Vitals/Pain Pain Assessment: 0-10 Pain Score: 8  Faces Pain Scale: Hurts even more Pain Location: right knee and thigh Pain Descriptors / Indicators: Aching;Sore;Grimacing;Guarding Pain Intervention(s): Limited activity within patient's tolerance;Monitored during session;Patient requesting pain meds-RN notified;Ice applied    Home Living Family/patient expects to be discharged to:: Private residence Living Arrangements: Spouse/significant other Available Help at Discharge: Family Type of Home: House Home Access: Stairs to enter   Secretary/administratorntrance Stairs-Number of Steps: 1 Home Layout: One level Home Equipment: Grab bars - toilet;Grab bars - tub/shower;Shower seat - built in;Walker - standard Additional Comments: pt does wood work for a hobby. He can borrow a 3:1 if needed. Pt is 5'11"    Prior Function Level of Independence: Independent               Hand Dominance        Extremity/Trunk Assessment   Upper Extremity Assessment Upper Extremity Assessment: Defer to OT evaluation;Overall WFL for tasks assessed    Lower Extremity Assessment Lower Extremity Assessment: RLE deficits/detail RLE Deficits / Details: ankle edematous, knee AAROM grossly 10* to 35*, limited by pain RLE: Unable to fully assess due  to pain       Communication   Communication: No difficulties  Cognition Arousal/Alertness: Awake/alert Behavior During Therapy: WFL for tasks assessed/performed Overall Cognitive Status: Within Functional Limits for tasks assessed                                        General Comments      Exercises Total Joint Exercises Ankle Circles/Pumps: AROM;Both;5 reps Heel Slides:  AAROM;Right;10 reps;Limitations Heel Slides Limitations: pain and muscle guarding   Assessment/Plan    PT Assessment Patient needs continued PT services  PT Problem List Decreased strength;Decreased range of motion;Decreased mobility;Pain;Decreased knowledge of precautions       PT Treatment Interventions Gait training;DME instruction;Therapeutic activities;Therapeutic exercise;Patient/family education;Stair training;Functional mobility training    PT Goals (Current goals can be found in the Care Plan section)  Acute Rehab PT Goals Patient Stated Goal:  get back to woodworking PT Goal Formulation: With patient Time For Goal Achievement: 12/03/17 Potential to Achieve Goals: Good    Frequency 7X/week   Barriers to discharge        Co-evaluation               AM-PAC PT "6 Clicks" Daily Activity  Outcome Measure Difficulty turning over in bed (including adjusting bedclothes, sheets and blankets)?: Unable Difficulty moving from lying on back to sitting on the side of the bed? : Unable Difficulty sitting down on and standing up from a chair with arms (e.g., wheelchair, bedside commode, etc,.)?: Unable Help needed moving to and from a bed to chair (including a wheelchair)?: A Little Help needed walking in hospital room?: A Little Help needed climbing 3-5 steps with a railing? : A Lot 6 Click Score: 11    End of Session Equipment Utilized During Treatment: Right knee immobilizer Activity Tolerance: Patient tolerated treatment well;Patient limited by pain Patient left: in bed;with call bell/phone within reach;with bed alarm set   PT Visit Diagnosis: Difficulty in walking, not elsewhere classified (R26.2);Pain Pain - Right/Left: Right Pain - part of body: Knee    Time: 1000-1013 PT Time Calculation (min) (ACUTE ONLY): 13 min   Charges:   PT Evaluation $PT Eval Low Complexity: 1 Low     PT G CodesDrucilla Chalet, PT Pager:  [phone] 11/19/2017   [facility] 11/19/2017, 10:26 AM

## 2017-11-19 NOTE — Evaluation (Signed)
Occupational Therapy Evaluation Patient Details Name: Kyle Meyers MRN: [id number] DOB: 09-17-1945 Today's Date: 11/19/2017    History of Present Illness s/p R TKA   Clinical Impression   This 71 year old man was admitted for the above sx. Will follow in acute setting to further educate on bathroom transfers    Follow Up Recommendations  Supervision/Assistance - 24 hour    Equipment Recommendations  None recommended by OT(has high toilet; can borrow 3:1)    Recommendations for Other Services       Precautions / Restrictions Precautions Precautions: Knee;Fall Required Braces or Orthoses: Knee Immobilizer - Right Restrictions Weight Bearing Restrictions: No      Mobility Bed Mobility               General bed mobility comments: min A for supine to sit; HOB raised and rails used  Transfers Overall transfer level: Needs assistance Equipment used: Rolling walker (2 wheeled) Transfers: Sit to/from Stand Sit to Stand: Min assist         General transfer comment: steadying assistance; cues for UE/LE placement    Balance                                           ADL either performed or assessed with clinical judgement   ADL Overall ADL's : Needs assistance/impaired     Grooming: Oral care;Supervision/safety;Standing       Lower Body Bathing: Moderate assistance;Sit to/from stand       Lower Body Dressing: Maximal assistance;Sit to/from stand                 General ADL Comments: ambulated to bathroom and performed oral care.  Pt is able to perform UB adls with set up.  He has access to 3;1 over commode if needed. Wife can assist with adls.  This was pt's first time out of bed (or dangling). Reviewed safety and knee precations     Vision         Perception     Praxis      Pertinent Vitals/Pain Pain Assessment: Faces Faces Pain Scale: Hurts even more Pain Location: R knee with weight bearing Pain Descriptors /  Indicators: Aching Pain Intervention(s): Limited activity within patient's tolerance;Monitored during session;Premedicated before session;Repositioned;Ice applied     Hand Dominance     Extremity/Trunk Assessment Upper Extremity Assessment Upper Extremity Assessment: Overall WFL for tasks assessed           Communication Communication Communication: No difficulties   Cognition Arousal/Alertness: Awake/alert Behavior During Therapy: WFL for tasks assessed/performed Overall Cognitive Status: Within Functional Limits for tasks assessed                                     General Comments       Exercises     Shoulder Instructions      Home Living Family/patient expects to be discharged to:: Private residence Living Arrangements: Spouse/significant other Available Help at Discharge: Family               Bathroom Shower/Tub: Walk-in Soil scientist Toilet: Handicapped height     Home Equipment: Grab bars - toilet;Grab bars - tub/shower;Shower seat - built in   Additional Comments: pt does wood work for a hobby. He can borrow a  3:1 if needed. Pt is 5'11"      Prior Functioning/Environment Level of Independence: Independent                 OT Problem List: Decreased activity tolerance;Pain;Decreased knowledge of use of DME or AE      OT Treatment/Interventions: Self-care/ADL training;DME and/or AE instruction;Patient/family education    OT Goals(Current goals can be found in the care plan section) Acute Rehab OT Goals Patient Stated Goal: be able to take a shower; get back to woodworking OT Goal Formulation: With patient Time For Goal Achievement: 12/03/17 Potential to Achieve Goals: Good ADL Goals Pt Will Transfer to Toilet: with min guard assist;ambulating;bedside commode(vs high commode with rail) Pt Will Perform Tub/Shower Transfer: Shower transfer;with min guard assist;ambulating;shower seat  OT Frequency: Min 2X/week    Barriers to D/C:            Co-evaluation              AM-PAC PT "6 Clicks" Daily Activity     Outcome Measure Help from another person eating meals?: None Help from another person taking care of personal grooming?: A Little Help from another person toileting, which includes using toliet, bedpan, or urinal?: A Little Help from another person bathing (including washing, rinsing, drying)?: A Lot Help from another person to put on and taking off regular upper body clothing?: A Little Help from another person to put on and taking off regular lower body clothing?: A Lot 6 Click Score: 17   End of Session    Activity Tolerance: Patient limited by pain Patient left: in chair;with call bell/phone within reach  OT Visit Diagnosis: Pain Pain - Right/Left: Right Pain - part of body: Knee                Time: [id number] OT Time Calculation (min): 22 min Charges:  OT General Charges $OT Visit: 1 Visit OT Evaluation $OT Eval Low Complexity: 1 Low G-Codes:     VerdiMaryellen Marella Meyers, OTR/L [phone] 11/19/2017  Kyle Meyers 11/19/2017, 9:24 AM

## 2017-11-19 NOTE — Discharge Instructions (Signed)

## 2017-11-19 NOTE — Progress Notes (Signed)
Subjective: 1 Day Post-Op Procedure(s) (LRB): RIGHT TOTAL KNEE ARTHROPLASTY (Right) Patient reports pain as moderate.    Objective: Vital signs in last 24 hours: Temp:  [97.4 F (36.3 C)-99.4 F (37.4 C)] 98.7 F (37.1 C) (04/13 1018) Pulse Rate:  [62-106] 106 (04/13 1018) Resp:  [12-21] 16 (04/13 1018) BP: (111-162)/(65-82) 162/70 (04/13 1018) SpO2:  [95 %-100 %] 96 % (04/13 1018)  Intake/Output from previous day: 04/12 0701 - 04/13 0700 In: 4062.5 [P.O.:700; I.V.:3112.5; IV Piggyback:250] Out: 1360 [Urine:1310; Blood:50] Intake/Output this shift: Total I/O In: 120 [P.O.:120] Out: -   Recent Labs    11/19/17 0526  HGB 13.7   Recent Labs    11/19/17 0526  WBC 13.1*  RBC 4.80  HCT 41.8  PLT 188   Recent Labs    11/19/17 0526  NA 136  K 4.2  CL 102  CO2 23  BUN 23*  CREATININE 0.89  GLUCOSE 123*  CALCIUM 8.1*   No results for input(s): LABPT, INR in the last 72 hours.  Sensation intact distally Intact pulses distally Dorsiflexion/Plantar flexion intact Incision: dressing C/D/I No cellulitis present Compartment soft   Patient's anticipated LOS is less than 2 midnights, meeting these requirements: - Younger than 465 - Lives within 1 hour of care - Has a competent adult at home to recover with post-op recover - NO history of  - Chronic pain requiring opiods  - Diabetes  - Coronary Artery Disease  - Heart failure  - Heart attack  - Stroke  - DVT/VTE  - Cardiac arrhythmia  - Respiratory Failure/COPD  - Renal failure  - Anemia  - Advanced Liver disease      Assessment/Plan: 1 Day Post-Op Procedure(s) (LRB): RIGHT TOTAL KNEE ARTHROPLASTY (Right) Up with therapy Plan for discharge tomorrow Discharge home with home health    Kathryne HitchChristopher Y Lekita Kerekes 11/19/2017, 10:58 AM

## 2017-11-20 NOTE — Care Management Note (Signed)
Case Management Note  Patient Details  Name: Kyle Meyers MRN: [id number] Date of Birth: 05-24-1946  Subjective/Objective:  S/p R TKA                  Action/Plan: [facility] to pt and [facility] Pt agreeable to [facility]. Pt has RW at home.  Expected Discharge Date:  11/20/17               Expected Discharge Plan:  Home w Home Health Services  In-House Referral:  NA  Discharge planning Services  CM Consult  Post Acute Care Choice:  Home Health Choice offered to:  Patient  DME Arranged:  N/A DME Agency:  NA  HH Arranged:  PT HH Agency:  [facility] (formerly [facility])  Status of Service:  Completed, signed off  If discussed at [employer] Meetings, dates discussed:    Additional Comments:  Elliot CousinShavis, Ivey Nembhard Ellen, RN 11/20/2017, 12:51 PM

## 2017-11-20 NOTE — Progress Notes (Signed)
Pt to d/c home. [facility] is set up for home health PT. AVS reviewed and "My Chart" discussed with pt. Pt capable of verbalizing medications, signs and symptoms of infection, and follow-up appointments. Remains hemodynamically stable. No signs and symptoms of distress. Educated pt to return to ER in the case of SOB, dizziness, or chest pain.

## 2017-11-20 NOTE — Discharge Summary (Signed)
Discharge Diagnoses:  Principal Problem:   Unilateral primary osteoarthritis, right knee Active Problems:   Status post total knee replacement, right   Surgeries: Procedure(s): RIGHT TOTAL KNEE ARTHROPLASTY on 11/18/2017    Consultants:   Discharged Condition: Improved  Hospital Course: Kyle Meyers is an 72 y.o. male who was admitted 11/18/2017 with a chief complaint of osteoarthritis right knee, with a final diagnosis of osteoarthritis right knee.  Patient was brought to the operating room on 11/18/2017 and underwent Procedure(s): RIGHT TOTAL KNEE ARTHROPLASTY.    Patient was given perioperative antibiotics:  Anti-infectives (From admission, onward)   Start     Dose/Rate Route Frequency Ordered Stop   11/18/17 1500  ceFAZolin (ANCEF) IVPB 2g/100 mL premix     2 g 200 mL/hr over 30 Minutes Intravenous Every 6 hours 11/18/17 1315 11/18/17 2132   11/18/17 0600  ceFAZolin (ANCEF) 3 g in dextrose 5 % 50 mL IVPB     3 g 130 mL/hr over 30 Minutes Intravenous On call to O.R. 11/17/17 1312 11/18/17 0930    .  Patient was given sequential compression devices, early ambulation, and aspirin for DVT prophylaxis.  Recent vital signs:  Patient Vitals for the past 24 hrs:  BP Temp Temp src Pulse Resp SpO2  11/20/17 0411 132/82 98.5 F (36.9 C) Oral 69 16 92 %  11/19/17 2031 137/82 99.6 F (37.6 C) Oral (!) 114 16 100 %  11/19/17 1326 135/75 97.8 F (36.6 C) Oral 81 16 100 %  .  Recent laboratory studies: No results found.  Discharge Medications:   Allergies as of 11/20/2017   No Known Allergies     Medication List    STOP taking these medications   aspirin EC 81 MG tablet     TAKE these medications   atorvastatin 20 MG tablet Commonly known as:  LIPITOR Take 1 tablet by mouth every morning.   ketoconazole 2 % cream Commonly known as:  NIZORAL Apply 1 application topically daily as needed for irritation.   lisinopril-hydrochlorothiazide 10-12.5 MG tablet Commonly  known as:  PRINZIDE,ZESTORETIC Take 1 tablet by mouth daily.   methocarbamol 500 MG tablet Commonly known as:  ROBAXIN Take 1 tablet (500 mg total) by mouth every 6 (six) hours as needed for muscle spasms.   multivitamin with minerals tablet Take 1 tablet by mouth daily.   NORVASC 5 MG tablet Generic drug:  amLODipine Take 1 tablet by mouth daily.   omeprazole 40 MG capsule Commonly known as:  PRILOSEC Take 40 mg by mouth at bedtime.   oxyCODONE 5 MG immediate release tablet Commonly known as:  Oxy IR/ROXICODONE Take 1-2 tablets (5-10 mg total) by mouth every 4 (four) hours as needed for moderate pain (pain score 4-6).   rivaroxaban 10 MG Tabs tablet Commonly known as:  XARELTO Take 1 tablet (10 mg total) by mouth daily with breakfast.   Vitamin D 2000 units tablet Take 2,000 Units by mouth daily.            Durable Medical Equipment  (From admission, onward)        Start     Ordered   11/18/17 1316  DME 3 n 1  Once     11/18/17 1315   11/18/17 1316  DME Walker rolling  Once    Question:  Patient needs a walker to treat with the following condition  Answer:  Status post total knee replacement, right   11/18/17 1315      Diagnostic Studies:  Dg Knee Right Port  Result Date: 11/18/2017 CLINICAL DATA:  Postop total right knee replacement EXAM: PORTABLE RIGHT KNEE - 1-2 VIEW COMPARISON:  None. FINDINGS: Changes of right knee replacement. No hardware bony complicating feature. IMPRESSION: Right knee replacement.  No complicating feature. Electronically Signed   By: Charlett NoseKevin  Dover M.D.   On: 11/18/2017 11:13    Patient benefited maximally from their hospital stay and there were no complications.     Disposition: Discharge disposition: 01-Home or Self Care      Discharge Instructions    Call MD / Call 911   Complete by:  As directed    If you experience chest pain or shortness of breath, CALL 911 and be transported to the hospital emergency room.  If you  develope a fever above 101 F, pus (white drainage) or increased drainage or redness at the wound, or calf pain, call your surgeon's office.   Constipation Prevention   Complete by:  As directed    Drink plenty of fluids.  Prune juice may be helpful.  You may use a stool softener, such as Colace (over the counter) 100 mg twice a day.  Use MiraLax (over the counter) for constipation as needed.   Diet - low sodium heart healthy   Complete by:  As directed    Increase activity slowly as tolerated   Complete by:  As directed      Follow-up Information    Kathryne HitchBlackman, Christopher Y, MD Follow up in 2 week(s).   Specialty:  Orthopedic Surgery Contact information: [address] [place] [address] [phone]            Signed: Nadara MustardMarcus V Arath Kaigler 11/20/2017, 12:06 PM

## 2017-11-20 NOTE — Progress Notes (Signed)
Occupational Therapy Treatment Patient Details Name: Hillery AldoJimmy Agro MRN: [id number] DOB: April 26, 1946 Today's Date: 11/20/2017    History of present illness s/p R TKA   OT comments  Pt doing well.  Pt has an accessible bathroom so does not need DME  Follow Up Recommendations  Supervision - Intermittent    Equipment Recommendations  None recommended by OT    Recommendations for Other Services      Precautions / Restrictions Precautions Precautions: Knee;Fall Precaution Booklet Issued: Yes (comment) Required Braces or Orthoses: Knee Immobilizer - Right Knee Immobilizer - Right: Discontinue once straight leg raise with < 10 degree lag Restrictions Weight Bearing Restrictions: No       Mobility Bed Mobility Overal bed mobility: Needs Assistance Bed Mobility: Supine to Sit;Sit to Supine     Supine to sit: Supervision Sit to supine: Supervision   General bed mobility comments: Pt used PT's hand for leverage to sit up on EOB. Will have wife to A at home.  He was able to return supine with S.  Transfers Overall transfer level: Needs assistance Equipment used: Rolling walker (2 wheeled) Transfers: Sit to/from [employer] CorporationStand;Stand Pivot Transfers Sit to Stand: Supervision Stand pivot transfers: Supervision       General transfer comment: cues for hand placement and RLE position        ADL either performed or assessed with clinical judgement   ADL Overall ADL's : Needs assistance/impaired     Grooming: Standing;Wash/dry hands           Upper Body Dressing : Set up;Sitting   Lower Body Dressing: Minimal assistance;Sit to/from stand;Cueing for sequencing;Cueing for compensatory techniques;Cueing for safety   Toilet Transfer: Min guard;RW;Comfort height toilet   Toileting- Clothing Manipulation and Hygiene: Min guard;Sit to/from Nurse, children'sstand     Tub/Shower Transfer Details (indicate cue type and reason): verbalized safety of walk in shower Functional mobility during ADLs:  Min guard General ADL Comments: pt doing well- wife will A as needed     Vision Patient Visual Report: No change from baseline            Cognition Arousal/Alertness: Awake/alert Behavior During Therapy: WFL for tasks assessed/performed Overall Cognitive Status: Within Functional Limits for tasks assessed                                          Exercises Exercises: Total Joint Total Joint Exercises Ankle Circles/Pumps: AROM;Both;10 reps Quad Sets: AROM;Both;10 reps Heel Slides: AROM;Right;10 reps Hip ABduction/ADduction: AROM;Right;10 reps Straight Leg Raises: AAROM;Right;10 reps   Shoulder Instructions       General Comments Discussed car transfer    Pertinent Vitals/ Pain       Pain Assessment: 0-10 Pain Score: 3  Pain Descriptors / Indicators: Aching;Sore Pain Intervention(s): Limited activity within patient's tolerance;Monitored during session;Repositioned;Ice applied     Prior Functioning/Environment              Frequency  Min 2X/week        Progress Toward Goals  OT Goals(current goals can now be found in the care plan section)  Progress towards OT goals: Progressing toward goals  Acute Rehab OT Goals Patient Stated Goal:  get back to woodworking  Plan Discharge plan remains appropriate       AM-PAC PT "6 Clicks" Daily Activity     Outcome Measure   Help from another person eating meals?: None Help from  another person taking care of personal grooming?: A Little Help from another person toileting, which includes using toliet, bedpan, or urinal?: A Little Help from another person bathing (including washing, rinsing, drying)?: A Little Help from another person to put on and taking off regular upper body clothing?: A Little Help from another person to put on and taking off regular lower body clothing?: A Little 6 Click Score: 19    End of Session CPM Right Knee CPM Right Knee: Off  Pain - part of body: Knee   Activity  Tolerance Patient tolerated treatment well   Patient Left in bed   Nurse Communication Mobility status        Time: 1100-1115 OT Time Calculation (min): 15 min  Charges: OT General Charges $OT Visit: 1 Visit OT Evaluation $OT Eval Low Complexity: 1 Low  [place], [place] [phone]   Alba Cory 11/20/2017, 12:14 PM

## 2017-11-20 NOTE — Progress Notes (Signed)
Physical Therapy Treatment Patient Details Name: Kyle Meyers MRN: [id number] DOB: [birth year] Today's Date: 11/20/2017    History of Present Illness s/p R TKA    PT Comments    Pt moving well and practiced 1 step this AM which is what he has for home entry.  Pt safe to d/c home from PT standpoint.  Recommend HHPT.  Pt states he has equipment.   Follow Up Recommendations  Home health PT;Follow surgeon's recommendation for DC plan and follow-up therapies     Equipment Recommendations  None recommended by PT    Recommendations for Other Services       Precautions / Restrictions Precautions Precautions: Knee;Fall Precaution Booklet Issued: Yes (comment) Required Braces or Orthoses: Knee Immobilizer - Right Knee Immobilizer - Right: Discontinue once straight leg raise with < 10 degree lag Restrictions Weight Bearing Restrictions: No    Mobility  Bed Mobility Overal bed mobility: Needs Assistance Bed Mobility: Supine to Sit;Sit to Supine     Supine to sit: Min assist Sit to supine: Supervision   General bed mobility comments: Pt used PT's hand for leverage to sit up on EOB. Will have wife to A at home.  He was able to return supine with S.  Transfers Overall transfer level: Needs assistance Equipment used: Rolling walker (2 wheeled) Transfers: Sit to/from Stand Sit to Stand: Min guard         General transfer comment: cues for hand placement and RLE position  Ambulation/Gait Ambulation/Gait assistance: Min guard Ambulation Distance (Feet): 110 Feet Assistive device: Rolling walker (2 wheeled) Gait Pattern/deviations: Step-to pattern;Antalgic     General Gait Details: Worked on increasing WB with gait, which improved antalgic gait pattern   Stairs             Wheelchair Mobility    Modified Rankin (Stroke Patients Only)       Balance                                            Cognition Arousal/Alertness:  Awake/alert Behavior During Therapy: WFL for tasks assessed/performed Overall Cognitive Status: Within Functional Limits for tasks assessed                                        Exercises Total Joint Exercises Ankle Circles/Pumps: AROM;Both;10 reps Quad Sets: AROM;Both;10 reps Heel Slides: AROM;Right;10 reps Hip ABduction/ADduction: AROM;Right;10 reps Straight Leg Raises: AAROM;Right;10 reps    General Comments General comments (skin integrity, edema, etc.): Discussed car transfer      Pertinent Vitals/Pain Pain Assessment: 0-10 Pain Score: 3  Pain Descriptors / Indicators: Aching;Sore Pain Intervention(s): Limited activity within patient's tolerance;Monitored during session;Repositioned;Ice applied    Home Living                      Prior Function            PT Goals (current goals can now be found in the care plan section) Acute Rehab PT Goals Patient Stated Goal:  get back to woodworking PT Goal Formulation: With patient Time For Goal Achievement: 12/03/17 Potential to Achieve Goals: Good Progress towards PT goals: Progressing toward goals    Frequency    7X/week      PT Plan Current plan remains appropriate  Co-evaluation              AM-PAC PT "6 Clicks" Daily Activity  Outcome Measure  Difficulty turning over in bed (including adjusting bedclothes, sheets and blankets)?: A Little Difficulty moving from lying on back to sitting on the side of the bed? : Unable Difficulty sitting down on and standing up from a chair with arms (e.g., wheelchair, bedside commode, etc,.)?: A Little Help needed moving to and from a bed to chair (including a wheelchair)?: A Little Help needed walking in hospital room?: A Little Help needed climbing 3-5 steps with a railing? : A Little 6 Click Score: 16    End of Session Equipment Utilized During Treatment: Gait belt;Right knee immobilizer Activity Tolerance: Patient tolerated treatment  well Patient left: in bed;with call bell/phone within reach   PT Visit Diagnosis: Difficulty in walking, not elsewhere classified (R26.2);Pain Pain - Right/Left: Right Pain - part of body: Knee     Time: [id number] PT Time Calculation (min) (ACUTE ONLY): 24 min  Charges:  $Gait Training: 8-22 mins $Therapeutic Exercise: 8-22 mins                    G Codes:       Caty Tessler L. Katrinka BlazingSmith, [place]PT Pager [phone] 11/20/2017    Enzo MontgomeryKaren L Schuyler Behan 11/20/2017, 11:27 AM

## 2017-11-21 NOTE — Op Note (Signed)
NAME:  Kyle Meyers, Kyle Meyers             ACCOUNT NO.:  [id number]  MEDICAL RECORD NO.:  [id number]  LOCATION:                                 FACILITY:  PHYSICIAN:  [name]. Magnus Ivan, M.D.DATE OF BIRTH:  DATE OF PROCEDURE:  11/18/2017 DATE OF DISCHARGE:                              OPERATIVE REPORT   PREOPERATIVE DIAGNOSIS:  Primary osteoarthritis and degenerative joint disease, right knee.  POSTOPERATIVE DIAGNOSIS:  Primary osteoarthritis and degenerative joint disease, right knee.  PROCEDURE:  Right total knee arthroplasty.  IMPLANTS:  Stryker Triathlon Press-Fit knee system with size 5 Press-Fit femur, size 5 Press-Fit tibial tray, size 38 Press-Fit patellar button, 11-mm fix-bearing polyethylene insert.  SURGEON:  [name]. Magnus Ivan, M.D.  ASSISTANT:  Kyle Canal, PA-C.  ANESTHESIA: 1. Right lower extremity adductor Meyers block. 2. Spinal. 3. General via an LMA. 4. Local with mixture of Exparel and saline.  TOURNIQUET TIME:  Under 1 hour.  BLOOD LOSS:  Less than 200 cc.  ANTIBIOTICS:  3 g of IV Ancef.  COMPLICATIONS:  None.  INDICATION:  The patient is a 72 year old gentleman with debilitating arthritis involving his right knee.  He has well documented x-rays showing varus malalignment with complete loss of the medial joint space and periarticular osteophytes throughout the knee.  He has tried and failed all forms of conservative treatment for many years on this knee including arthroscopic intervention.  At this point, his right knee pain is daily and it has detrimentally affected his activities of daily living, his quality of life and his mobility.  At this point, he does wish to proceed with a total knee arthroplasty on his right side.  He understands fully the risks of acute blood loss anemia, nerve and vessel injury, fracture, infection, and DVT.  He understands our goals are to decrease pain, improve mobility and overall improved quality of  life.  PROCEDURE DESCRIPTION:  After informed consent was obtained, appropriate right knee was marked.  He was brought to the operating room, where an adductor Meyers block was placed in the holding room.  He was setup on a stretcher and spinal anesthesia was obtained.  He was placed supine on the operating table.  Foley catheter was placed.  Nonsterile tourniquet was placed on his upper right thigh, and his right thigh, knee, leg, ankle and foot were prepped and draped with DuraPrep and sterile drapes including a sterile stockinette.  Time-out was called and he was identified as correct patient and correct right knee.  We then used an Esmarch to wrap out the leg and tourniquet was inflated to 300 mm of pressure.  We then tested him as far the skin goes and impinged him very hard and he did not move.  We made our incision with a #10 blade and carried this proximally and distally.  We dissected down the knee joint and carried out a medial parapatellar arthrotomy, he did not move.  Once I started to cut deeper tissues and cauterized deeper tissues, he definitely was experiencing pain.  So, general anesthesia was obtained via an LMA and we proceeded with the case.  With the knee in a flexed position, we removed remnants of the ACL, PCL,  medial and lateral meniscus and found significant cartilage loss throughout the knee with varus malalignment.  Using the extramedullary cutting guide for making our tibial cut, we corrected for neutral slope and correct for varus and valgus.  We took 9 mm off the high side, making our tibial cut without difficulty.  We then went to the femur and used an intramedullary drill guide for the distal femoral cut setting this for 10 mm distal femoral cut based on a slight flexion contracture with 5 degrees externally rotated for right knee.  We made the distal femoral cut without difficulty and brought the knee back down to full extension and removed all remnants of  the meniscus from the back of the knee and with a 9-mm extension block, he is slightly hyperextended.  We went back to the femur and placed our femoral sizing guide based off the epicondylar axis and chose a size 5 femur.  We put our 4-in-1 cutting block for a size 5 femur, made our anterior and posterior cuts followed by our chamfer cuts.  We then made our femoral box cut.  We then went back to the tibia and we felt that he had good enough tibia bone that he would benefit from Press-Fit knee system.  We chose a size 5 tibial tray for coverage and made our keel punch off this.  We did our final tibial preparation as well.  With the trial size 5 tibia followed by the size 5 femur, we trialed 11-mm fix-bearing polyethylene insert and we were pleased with the range of motion and stability from that.  We were pleased with stability.  We made our patellar cut and drilled 3 holes for a size 38 Press-Fit patellar button.  We then removed all instrumentation from the knee and irrigated the knee with normal saline solution using pulsatile lavage.  We were able to then place our Exparel throughout the joint capsule with a mixture of Exparel and saline.  With the knee in a flexed position, placed our real Press-Fit Stryker size 5 tibia followed by the size right 5 Press-Fit femur.  We placed our real fix-bearing 11-mm polyethylene insert and Press-Fit our patellar button.  We were pleased with range of motion and stability following this.  We then let the tourniquet down and hemostasis was obtained with electrocautery.  We closed our arthrotomy with interrupted #1 Vicryl suture followed by 0 Vicryl in the deep tissue, 2-0 Vicryl in the subcutaneous tissue, interrupted staples on the skin.  Xeroform and well-padded sterile dressing were applied.  He was awakened, extubated and taken to the recovery room in stable condition.  All final counts were correct. There were no complications noted.  Of note,  Kyle CanalGilbert Clark, PA-C, assisted in the entire case.  His assistance was crucial for facilitating all aspects of this case.     Vanita Pandahristopher Y. Magnus Meyers, M.D.     CYB/MEDQ  D:  11/18/2017  T:  11/19/2017  Job:  [id number]

## 2017-11-22 ENCOUNTER — Telehealth (INDEPENDENT_AMBULATORY_CARE_PROVIDER_SITE_OTHER): Payer: Self-pay | Admitting: Orthopaedic Surgery

## 2017-11-22 NOTE — Telephone Encounter (Signed)
I'm fine with him trying an ice cooler.

## 2017-11-22 NOTE — Telephone Encounter (Signed)
Patient aware this will be fine

## 2017-11-22 NOTE — Telephone Encounter (Signed)
Patient had a knee replacement last Friday and is having trouble keeping the swelling down.  He is using the ice packs but the swelling is not staying down with just the ice packs.  He is wanting to know if the ice cooler would be something better for him to use.  CB#[phone].  Thank you.

## 2017-11-22 NOTE — Telephone Encounter (Signed)
[facility]  [phone]   Verbal order  3 times a week for 4 weeks

## 2017-11-22 NOTE — Telephone Encounter (Signed)
Please advise 

## 2017-11-22 NOTE — Telephone Encounter (Signed)
Verbal order given  

## 2017-11-23 ENCOUNTER — Telehealth (INDEPENDENT_AMBULATORY_CARE_PROVIDER_SITE_OTHER): Payer: Self-pay | Admitting: Orthopaedic Surgery

## 2017-11-23 NOTE — Telephone Encounter (Signed)
See below

## 2017-11-23 NOTE — Telephone Encounter (Signed)
Have him take a 325 mg aspirin twice daily instead then

## 2017-11-23 NOTE — Telephone Encounter (Signed)
Left message on voicemail to take 325 asa twice a day

## 2017-11-23 NOTE — Telephone Encounter (Signed)
Patients wife called, she is wondering if patient could be prescribed a different blood thinner other than xarelto. She went to the pharmacy to pick it up and it was over $200, they are on a fixed income so they need something a little cheaper. Please call the wife back when possible # [phone]

## 2017-12-01 ENCOUNTER — Inpatient Hospital Stay (INDEPENDENT_AMBULATORY_CARE_PROVIDER_SITE_OTHER): Payer: Medicare Other | Admitting: Orthopaedic Surgery

## 2017-12-05 ENCOUNTER — Ambulatory Visit (INDEPENDENT_AMBULATORY_CARE_PROVIDER_SITE_OTHER): Payer: Medicare Other | Admitting: Orthopaedic Surgery

## 2017-12-05 ENCOUNTER — Other Ambulatory Visit (INDEPENDENT_AMBULATORY_CARE_PROVIDER_SITE_OTHER): Payer: Self-pay

## 2017-12-05 ENCOUNTER — Encounter (INDEPENDENT_AMBULATORY_CARE_PROVIDER_SITE_OTHER): Payer: Self-pay | Admitting: Orthopaedic Surgery

## 2017-12-05 DIAGNOSIS — Z96651 Presence of right artificial knee joint: Secondary | ICD-10-CM

## 2017-12-05 NOTE — Progress Notes (Signed)
The patient is 2 weeks tomorrow status post a right total knee replacement.  He is doing well.  Is not taking any pain medications.  He just finished his Xarelto as well.  He has home therapy coming this week and is ready to transition to outpatient physical therapy.  On exam the staples were removed and Steri-Strips applied.  He has a most full extension with a moderate hemarthrosis.  His flexion is to about 85 degrees.  His calf is soft as well.  He is ready to transition outpatient therapy and we can set this up at [facility].  They will work on quad strengthening exercises as well as range of motion and proprioception and balance and coordination.  We will see him back in 4 weeks to see how is doing overall but no x-rays are needed.

## 2017-12-08 ENCOUNTER — Encounter: Payer: Self-pay | Admitting: Surgery

## 2017-12-12 ENCOUNTER — Encounter: Payer: Self-pay | Admitting: Physical Therapy

## 2017-12-12 ENCOUNTER — Ambulatory Visit: Payer: Medicare Other | Attending: Orthopaedic Surgery | Admitting: Physical Therapy

## 2017-12-12 ENCOUNTER — Other Ambulatory Visit: Payer: Self-pay

## 2017-12-12 DIAGNOSIS — M25561 Pain in right knee: Secondary | ICD-10-CM | POA: Diagnosis not present

## 2017-12-12 DIAGNOSIS — R29898 Other symptoms and signs involving the musculoskeletal system: Secondary | ICD-10-CM | POA: Diagnosis present

## 2017-12-12 DIAGNOSIS — R262 Difficulty in walking, not elsewhere classified: Secondary | ICD-10-CM | POA: Diagnosis present

## 2017-12-12 DIAGNOSIS — M25661 Stiffness of right knee, not elsewhere classified: Secondary | ICD-10-CM | POA: Diagnosis present

## 2017-12-12 NOTE — Patient Instructions (Signed)
Heel Slide   Bend knee and pull heel toward buttocks. Hold __5-10__ seconds. Return. Repeat with other knee. Repeat _10-15___ times. Do _2___ sessions per day. **can use towel under foot and assist with pulling knee into bent position**  Chair Knee Flexion   Keeping feet on floor, slide foot of operated leg back, bending knee. Hold __15-20__ seconds. Repeat _10___ times. Do __2__ sessions a day.  Strengthening: Straight Leg Raise (Phase 1)   Tighten muscles on front of right thigh, then lift leg __~8__ inches from surface, keeping knee locked.  Repeat __10-15__ times per set. Do __2__ sets per session.

## 2017-12-12 NOTE — Therapy (Signed)
[facility] [address]  [address] [place], [place], [address] Phone: [phone]   Fax:  [phone]  Physical Therapy Evaluation  Patient Details  Name: Kyle Meyers MRN: [id number] Date of Birth: [birth year] Referring Provider: Doneen Poisson   Encounter Date: 12/12/2017  PT End of Session - 12/12/17 1537    Visit Number  1    Number of Visits  16    Date for PT Re-Evaluation  02/06/18    Authorization Type  Medicare    PT Start Time  1443    PT Stop Time  1522    PT Time Calculation (min)  39 min    Activity Tolerance  Patient tolerated treatment well    Behavior During Therapy  [facility] for tasks assessed/performed       Past Medical History:  Diagnosis Date  . Complication of anesthesia   . GERD (gastroesophageal reflux disease)   . History of colon polyps   . Hypercholesteremia   . Hypertension   . OA (osteoarthritis)   . Obesity   . PONV (postoperative nausea and vomiting)    Vomiting only-Pt also reported excessive gas  . Rectal bleed 09/2017  . Reflux   . Ureteral stone     Past Surgical History:  Procedure Laterality Date  . APPENDECTOMY    . BILATERAL KNEE ARTHROSCOPY    . COLONOSCOPY  09/2017  . ESOPHAGOGASTRODUODENOSCOPY    . MOUTH SURGERY    . PALATE / UVULA BIOPSY / EXCISION    . TONSILLECTOMY    . TOTAL KNEE ARTHROPLASTY Right 11/18/2017   Procedure: RIGHT TOTAL KNEE ARTHROPLASTY;  Surgeon: Kathryne Hitch, MD;  Location: [facility];  Service: Orthopedics;  Laterality: Right;    There were no vitals filed for this visit.   Subjective Assessment - 12/12/17 1445    Subjective  Patient with R TKA on 11/18/17 and follow up appointment to remove stiches 11/25/17. Reports he was using rolling walker, then cane for a while but has since stopped using both. Reports he is having some tightness in R knee. Received home health for 3 weeks for stretching/strengthening and balance. Reports he  did not get clearance to swim but went to the pool yesterday.     How long can you walk comfortably?  20-30 minutes    Patient Stated Goals  wants to get to 120 degrees flexion and decrease pain and tightness in his knee    Currently in Pain?  Yes    Pain Score  0-No pain    Pain Location  Knee    Pain Orientation  Right    Pain Type  Acute pain;Surgical pain    Aggravating Factors   prolonged walking    Pain Relieving Factors  ice    Multiple Pain Sites  No         [facility] PT Assessment - 12/12/17 1453      Assessment   Medical Diagnosis  s/p R TKA    Referring Provider  Doneen Poisson    Onset Date/Surgical Date  11/18/17    Next MD Visit  01/03/2018    Prior Therapy  Home health      Restrictions   Weight Bearing Restrictions  Yes    Other Position/Activity Restrictions  WBAT      Balance Screen   Has the patient fallen in the past 6 months  No    Has the patient had a decrease in activity level because  of a fear of falling?   No    Is the patient reluctant to leave their home because of a fear of falling?   No      Home Environment   Living Environment  Private residence    Living Arrangements  Spouse/significant other    Type of Home  House    Home Access  Stairs to enter    Entrance Stairs-Number of Steps  1    Entrance Stairs-Rails  None    Home Layout  One level    Home Equipment  Grab bars - tub/shower;Cane - single point    Additional Comments  Adaptive bathroom      Prior Function   Level of Independence  Independent    Vocation  Retired    Leisure  [employer] working      Cognition   Overall Cognitive Status  Within [employer] for tasks assessed      Observation/Other Assessments   Focus on Therapeutic Outcomes (FOTO)   Knee: 46 (54% limited, predicted 34%)      Sensation   Light Touch  Appears Intact      ROM / Strength   AROM / PROM / Strength  AROM;Strength;PROM      AROM   AROM Assessment Site  Knee    Right/Left Knee  Right;Left     Right Knee Extension  5    Right Knee Flexion  80    Left Knee Extension  2    Left Knee Flexion  130      PROM   PROM Assessment Site  Knee    Right/Left Knee  Right    Right Knee Extension  3    Right Knee Flexion  87      Strength   Strength Assessment Site  Hip;Knee;Ankle    Right/Left Hip  Right;Left    Right Hip Flexion  4+/5    Right Hip External Rotation   4/5    Right Hip ABduction  4/5 knee straight    Left Hip Flexion  5/5    Left Hip External Rotation  4/5    Left Hip ABduction  4/5 knee straight    Right/Left Knee  Right;Left    Right Knee Flexion  5/5    Right Knee Extension  4/5    Left Knee Flexion  4/5    Left Knee Extension  5/5    Right/Left Ankle  Right;Left    Right Ankle Dorsiflexion  4+/5    Right Ankle Plantar Flexion  4+/5    Left Ankle Dorsiflexion  4+/5    Left Ankle Plantar Flexion  4+/5      Palpation   Patella mobility  mild hypomobility in superior direction; all other planes WNL    Palpation comment  diffusely non-tender      Ambulation/Gait   Ambulation/Gait  --    Gait Pattern  Antalgic;Lateral trunk lean to left;Lateral trunk lean to right                Objective measurements completed on examination: See above findings.              PT Education - 12/12/17 1536    Education provided  Yes    Education Details  Prognosis, POC, HEP    Person(s) Educated  Patient    Methods  Handout;Explanation    Comprehension  Verbalized understanding       PT Short Term Goals - 12/12/17 1541  PT SHORT TERM GOAL #1   Title  Patient to be independent with initial HEP.    Time  2    Period  Weeks    Status  New    Target Date  12/26/17      PT SHORT TERM GOAL #2   Title  Patient to demonstrate PROM of R knee 0-95.    Time  2    Period  Weeks    Status  New    Target Date  12/26/17        PT Long Term Goals - 12/12/17 1543      PT LONG TERM GOAL #1   Title  Patient to be independent with advanced HEP.     Time  8    Period  Weeks    Status  New    Target Date  02/06/18      PT LONG TERM GOAL #2   Title  Patient to demonstrate R knee AROM 0-120.    Time  8    Period  Weeks    Status  New    Target Date  02/06/18      PT LONG TERM GOAL #3   Title  Patient will demonstrate SLR without quad lag in order to improve gait mechanics during ambulation.    Time  8    Period  Weeks    Status  New    Target Date  02/06/18      PT LONG TERM GOAL #4   Title  Patient to demonstrate reciprocal gait pattern up/down a flight of stairs with single handrail.    Time  8    Period  Weeks    Status  New    Target Date  02/06/18      PT LONG TERM GOAL #5   Title  Patient to demonstrate >=4+/5 R LE strength in order to improve standing tolerance while woodworking.     Time  8    Period  Weeks    Status  New    Target Date  02/06/18             Plan - 12/12/17 1548    Clinical Impression Statement  Patient is a 71y/o M presenting to OPPT s/p R TKA on 11/18/2017. Ambulating without AD with minimal pain when standing or walking for prolonged periods. Reports he is mostly limited by lack of motion. Reports he swam in pool yesterday- educated on important of clearing immersion of knee with MD due to risk of infection. Patient presents with the following impairments: decreased ROM, decreased strength, impaired joint mobility, impaired gait. Will benefit from skilled physical therapy services in order to address aforementioned impairements. Patient educated on HEP and received handout.      Clinical Presentation  Stable    Clinical Presentation due to:  No significant comorbidities    Clinical Decision Making  Low    Rehab Potential  Good    PT Frequency  2x / week    PT Duration  8 weeks    PT Treatment/Interventions  ADLs/Self Care Home Management;Cryotherapy;Electrical Stimulation;Ultrasound;Gait training;Stair training;Functional mobility training;Therapeutic activities;Therapeutic  exercise;Balance training;Neuromuscular re-education;Patient/family education;Manual techniques;Scar mobilization;Passive range of motion;Dry needling;Taping;Vasopneumatic Device;Moist Heat    PT Next Visit Plan  reassess HEP; progress quad strengthening     Consulted and Agree with Plan of Care  Patient       Patient will benefit from skilled therapeutic intervention in order to improve the following deficits and impairments:  Abnormal gait, Decreased activity tolerance, Decreased balance, Decreased endurance, Decreased mobility, Decreased range of motion, Decreased strength, Difficulty walking, Pain  Visit Diagnosis: Acute pain of right knee  Stiffness of right knee, not elsewhere classified  Difficulty in walking, not elsewhere classified  Other symptoms and signs involving the musculoskeletal system     Problem List Patient Active Problem List   Diagnosis Date Noted  . Status post total knee replacement, right 11/18/2017  . Chronic pain of right knee 03/30/2017  . Unilateral primary osteoarthritis, right knee 03/30/2017   Anette Guarneri, PT, DPT 12/12/17 4:02 PM   [facility] [address]  [address] [place], [place], [address] Phone: [phone]   Fax:  [phone]  Name: Kyle Meyers MRN: [id number] Date of Birth: Dec 25, 1945

## 2017-12-15 ENCOUNTER — Ambulatory Visit: Payer: Medicare Other

## 2017-12-15 DIAGNOSIS — M25561 Pain in right knee: Secondary | ICD-10-CM

## 2017-12-15 DIAGNOSIS — M25661 Stiffness of right knee, not elsewhere classified: Secondary | ICD-10-CM

## 2017-12-15 DIAGNOSIS — R262 Difficulty in walking, not elsewhere classified: Secondary | ICD-10-CM

## 2017-12-15 DIAGNOSIS — R29898 Other symptoms and signs involving the musculoskeletal system: Secondary | ICD-10-CM

## 2017-12-15 NOTE — Therapy (Signed)
[facility] Outpatient Rehabilitation [facility] [address]  [address] [place], [place], [address] Phone: [phone]   Fax:  [phone]  Physical Therapy Treatment  Patient Details  Name: Kyle Meyers MRN: [id number] Date of Birth: [birth year] Referring Provider: Doneen Poisson   Encounter Date: 12/15/2017  PT End of Session - 12/15/17 1319    Visit Number  2    Number of Visits  16    Date for PT Re-Evaluation  02/06/18    Authorization Type  Medicare    PT Start Time  1315    PT Stop Time  1409    PT Time Calculation (min)  54 min    Activity Tolerance  Patient tolerated treatment well    Behavior During Therapy  [facility] for tasks assessed/performed       Past Medical History:  Diagnosis Date  . Complication of anesthesia   . GERD (gastroesophageal reflux disease)   . History of colon polyps   . Hypercholesteremia   . Hypertension   . OA (osteoarthritis)   . Obesity   . PONV (postoperative nausea and vomiting)    Vomiting only-Pt also reported excessive gas  . Rectal bleed 09/2017  . Reflux   . Ureteral stone     Past Surgical History:  Procedure Laterality Date  . APPENDECTOMY    . BILATERAL KNEE ARTHROSCOPY    . COLONOSCOPY  09/2017  . ESOPHAGOGASTRODUODENOSCOPY    . MOUTH SURGERY    . PALATE / UVULA BIOPSY / EXCISION    . TONSILLECTOMY    . TOTAL KNEE ARTHROPLASTY Right 11/18/2017   Procedure: RIGHT TOTAL KNEE ARTHROPLASTY;  Surgeon: Kathryne Hitch, MD;  Location: [facility];  Service: Orthopedics;  Laterality: Right;    There were no vitals filed for this visit.  Subjective Assessment - 12/15/17 1528    Subjective  Pt. doing well today reporting, "I feel good with my home program since they are similar to my home-health exercise".      Patient Stated Goals  wants to get to 120 degrees flexion and decrease pain and tightness in his knee    Currently in Pain?  No/denies    Pain Score  0-No pain    Multiple Pain Sites  No                        [facility] Adult PT Treatment/Exercise - 12/15/17 1348      Knee/Hip Exercises: Stretches   Knee: Self-Stretch to increase Flexion  Right 5" x 10 reps with LE on 9" step and UE support       Knee/Hip Exercises: Aerobic   Recumbent Bike  Lvl 1 - partial revolutions      Knee/Hip Exercises: Machines for Strengthening   Cybex Leg Press  B LE's: 20# x 15 reps       Knee/Hip Exercises: Standing   Forward Step Up  Right;10 reps;1 set;Step Height: 6";Hand Hold: 1    Forward Step Up Limitations  red TB TKE; 1 UE support     Functional Squat  10 reps;3 seconds    Functional Squat Limitations  counter; minor cueing to maintain even wt. distribution on LE's at bottom of movement       Knee/Hip Exercises: Seated   Long Arc Quad  Right;10 reps;Weights    Long Arc Quad Weight  2 lbs.    Long Arc Quad Limitations  adduction ball squeeze    Hamstring Curl  Right;10 reps;Strengthening    Hamstring Limitations  red TB       Knee/Hip Exercises: Supine   Straight Leg Raises  Right;15 reps;Strengthening    Straight Leg Raises Limitations  quad set prior to each rep      Modalities   Modalities  Vasopneumatic      Vasopneumatic   Number Minutes Vasopneumatic   10 minutes    Vasopnuematic Location   Knee R    Vasopneumatic Pressure  Medium    Vasopneumatic Temperature   Lowest temp.        Manual Therapy   Manual Therapy  Joint mobilization    Manual therapy comments  Hooklying     Joint Mobilization  R knee patellar mobs (good mobility all directions); R knee A/P mobs (slight limited mobility)             PT Education - 12/15/17 1514    Education provided  Yes    Education Details  Lunge knee flexion stretch (as pt. already performing on his own at home)    Person(s) Educated  Patient    Methods  Explanation;Demonstration;Verbal cues;Handout    Comprehension  Verbalized understanding;Returned demonstration;Verbal cues required;Need further  instruction       PT Short Term Goals - 12/15/17 1324      PT SHORT TERM GOAL #1   Title  Patient to be independent with initial HEP.    Time  2    Period  Weeks    Status  On-going      PT SHORT TERM GOAL #2   Title  Patient to demonstrate PROM of R knee 0-95.    Time  2    Period  Weeks    Status  On-going        PT Long Term Goals - 12/15/17 1327      PT LONG TERM GOAL #1   Title  Patient to be independent with advanced HEP.    Time  8    Period  Weeks    Status  On-going      PT LONG TERM GOAL #2   Title  Patient to demonstrate R knee AROM 0-120.    Time  8    Period  Weeks    Status  On-going      PT LONG TERM GOAL #3   Title  Patient will demonstrate SLR without quad lag in order to improve gait mechanics during ambulation.    Time  8    Period  Weeks    Status  On-going      PT LONG TERM GOAL #4   Title  Patient to demonstrate reciprocal gait pattern up/down a flight of stairs with single handrail.    Time  8    Period  Weeks    Status  On-going      PT LONG TERM GOAL #5   Title  Patient to demonstrate >=4+/5 R LE strength in order to improve standing tolerance while woodworking.     Time  8    Period  Weeks    Status  On-going            Plan - 12/15/17 1322    Clinical Impression Statement  [name] doing well today seen to start treatment ambulating without AD and only with slight antalgic gait on R however walking with good stability.  Tolerated progression of strengthening and ROM therex well today focused on improving functional flexion ROM.  Pt. demonstrating good R  patellar mobility today.  Ended treatment with pt. noting low-level R knee pain however requesting ice/compression to reduce post-exercise swelling and pain.      PT Treatment/Interventions  ADLs/Self Care Home Management;Cryotherapy;Electrical Stimulation;Ultrasound;Gait training;Stair training;Functional mobility training;Therapeutic activities;Therapeutic exercise;Balance  training;Neuromuscular re-education;Patient/family education;Manual techniques;Scar mobilization;Passive range of motion;Dry needling;Taping;Vasopneumatic Device;Moist Heat    PT Next Visit Plan  Progress quad strengthening     Consulted and Agree with Plan of Care  Patient       Patient will benefit from skilled therapeutic intervention in order to improve the following deficits and impairments:  Abnormal gait, Decreased activity tolerance, Decreased balance, Decreased endurance, Decreased mobility, Decreased range of motion, Decreased strength, Difficulty walking, Pain  Visit Diagnosis: Acute pain of right knee  Stiffness of right knee, not elsewhere classified  Difficulty in walking, not elsewhere classified  Other symptoms and signs involving the musculoskeletal system     Problem List Patient Active Problem List   Diagnosis Date Noted  . Status post total knee replacement, right 11/18/2017  . Chronic pain of right knee 03/30/2017  . Unilateral primary osteoarthritis, right knee 03/30/2017    Kermit Balo, PTA 12/15/17 3:29 PM  [facility] Outpatient Rehabilitation [facility] [address]  [address] [place], [place], [address] Phone: [phone]   Fax:  [phone]  Name: Carman Essick MRN: [id number] Date of Birth: 12-Nov-1945

## 2017-12-19 ENCOUNTER — Ambulatory Visit: Payer: Medicare Other | Admitting: Physical Therapy

## 2017-12-19 ENCOUNTER — Encounter: Payer: Self-pay | Admitting: Physical Therapy

## 2017-12-19 DIAGNOSIS — M25661 Stiffness of right knee, not elsewhere classified: Secondary | ICD-10-CM

## 2017-12-19 DIAGNOSIS — M25561 Pain in right knee: Secondary | ICD-10-CM | POA: Diagnosis not present

## 2017-12-19 DIAGNOSIS — R262 Difficulty in walking, not elsewhere classified: Secondary | ICD-10-CM

## 2017-12-19 DIAGNOSIS — R29898 Other symptoms and signs involving the musculoskeletal system: Secondary | ICD-10-CM

## 2017-12-19 NOTE — Therapy (Signed)
[facility] Outpatient Rehabilitation [facility] [address]  [address] [place], [place], [address] Phone: [phone]   Fax:  [phone]  Physical Therapy Treatment  Patient Details  Name: Kyle Meyers MRN: [id number] Date of Birth: 1945-09-11 Referring Provider: Doneen Poisson   Encounter Date: 12/19/2017  PT End of Session - 12/19/17 1507    Visit Number  3    Number of Visits  16    Date for PT Re-Evaluation  02/06/18    Authorization Type  Medicare    PT Start Time  1405    PT Stop Time  1500    PT Time Calculation (min)  55 min    Activity Tolerance  Patient tolerated treatment well    Behavior During Therapy  [facility] for tasks assessed/performed       Past Medical History:  Diagnosis Date  . Complication of anesthesia   . GERD (gastroesophageal reflux disease)   . History of colon polyps   . Hypercholesteremia   . Hypertension   . OA (osteoarthritis)   . Obesity   . PONV (postoperative nausea and vomiting)    Vomiting only-Pt also reported excessive gas  . Rectal bleed 09/2017  . Reflux   . Ureteral stone     Past Surgical History:  Procedure Laterality Date  . APPENDECTOMY    . BILATERAL KNEE ARTHROSCOPY    . COLONOSCOPY  09/2017  . ESOPHAGOGASTRODUODENOSCOPY    . MOUTH SURGERY    . PALATE / UVULA BIOPSY / EXCISION    . TONSILLECTOMY    . TOTAL KNEE ARTHROPLASTY Right 11/18/2017   Procedure: RIGHT TOTAL KNEE ARTHROPLASTY;  Surgeon: Kathryne Hitch, MD;  Location: [facility];  Service: Orthopedics;  Laterality: Right;    There were no vitals filed for this visit.  Subjective Assessment - 12/19/17 1407    Subjective  Patient reports he had a busy weekend but did not have much trouble with his knee. Had to walk about 200 yards total and a lot of time sitting in the car. Thinks that the top of his knee is loosening up.    Patient Stated Goals  wants to get to 120 degrees flexion and decrease pain and tightness in his knee    Currently in Pain?  Yes    Pain Score  5     Pain Location  Knee    Pain Orientation  Right    Pain Descriptors / Indicators  [facility] Adult PT Treatment/Exercise - 12/19/17 1413      Knee/Hip Exercises: Stretches   Gastroc Stretch  Both;20 seconds;2 reps      Knee/Hip Exercises: Aerobic   Recumbent Bike  Lvl 1 - partial revolutions 5 min      Knee/Hip Exercises: Machines for Strengthening   Cybex Leg Press  B LE's: 20# x 15 reps       Knee/Hip Exercises: Standing   Functional Squat  3 seconds;15 reps    Functional Squat Limitations  at countertop; TCs to shift weight R    Gait Training  Stair climbing; 13 steps 8'; step-to down, reciprcal up with R railing    Other Standing Knee Exercises  Standing R knee flexion stretch on 12' step; 10x5 sec      Knee/Hip Exercises: Seated   Sit to Sand  10 reps;without UE support  Knee/Hip Exercises: Supine   Straight Leg Raises  Strengthening;Right;15 reps    Straight Leg Raises Limitations  -- VCs to keep knee straight    Straight Leg Raise with External Rotation  Strengthening;Right;15 reps      Vasopneumatic   Number Minutes Vasopneumatic   15 minutes    Vasopnuematic Location   Knee right    Vasopneumatic Pressure  Medium    Vasopneumatic Temperature   Lowest temp.        Manual Therapy   Manual Therapy  Joint mobilization;Passive ROM    Manual therapy comments  Supine    Joint Mobilization  R patellar mobs in all directions     Passive ROM  R knee flexion with OP to tolerance               PT Short Term Goals - 12/15/17 1324      PT SHORT TERM GOAL #1   Title  Patient to be independent with initial HEP.    Time  2    Period  Weeks    Status  On-going      PT SHORT TERM GOAL #2   Title  Patient to demonstrate PROM of R knee 0-95.    Time  2    Period  Weeks    Status  On-going        PT Long Term Goals - 12/15/17 1327      PT LONG TERM GOAL #1   Title   Patient to be independent with advanced HEP.    Time  8    Period  Weeks    Status  On-going      PT LONG TERM GOAL #2   Title  Patient to demonstrate R knee AROM 0-120.    Time  8    Period  Weeks    Status  On-going      PT LONG TERM GOAL #3   Title  Patient will demonstrate SLR without quad lag in order to improve gait mechanics during ambulation.    Time  8    Period  Weeks    Status  On-going      PT LONG TERM GOAL #4   Title  Patient to demonstrate reciprocal gait pattern up/down a flight of stairs with single handrail.    Time  8    Period  Weeks    Status  On-going      PT LONG TERM GOAL #5   Title  Patient to demonstrate >=4+/5 R LE strength in order to improve standing tolerance while woodworking.     Time  8    Period  Weeks    Status  On-going            Plan - 12/19/17 1448    Clinical Impression Statement  Patient arrived with report that R knee has been doing well, just having intermittent R medial knee pain when stretching into flexion. Compliant with HEP and trying to work on recumbent bike at home as well. Patient tolerated R patellar mobilization in all directions; observed good patellar mobility in all directions however R lower thigh and knee diffusely edematous. Patient tolerated R knee PROM flexion with OP with moderate pain at end range, pain dissipated after easing off. Performed progressive quad strengthening with good effort and form. Able to perform stair climbing this date- able to reciprocally step up stairs, but limited by R knee flexion, causing step-to pattern going down stairs. Received Gameready to R  knee at end of session; normal integumentary response at end of session.     PT Treatment/Interventions  ADLs/Self Care Home Management;Cryotherapy;Electrical Stimulation;Ultrasound;Gait training;Stair training;Functional mobility training;Therapeutic activities;Therapeutic exercise;Balance training;Neuromuscular re-education;Patient/family  education;Manual techniques;Scar mobilization;Passive range of motion;Dry needling;Taping;Vasopneumatic Device;Moist Heat    PT Next Visit Plan  Reassess stairs, progress quad strengthening, work on gait training    Consulted and Agree with Plan of Care  Patient       Patient will benefit from skilled therapeutic intervention in order to improve the following deficits and impairments:  Abnormal gait, Decreased activity tolerance, Decreased balance, Decreased endurance, Decreased mobility, Decreased range of motion, Decreased strength, Difficulty walking, Pain  Visit Diagnosis: Acute pain of right knee  Stiffness of right knee, not elsewhere classified  Difficulty in walking, not elsewhere classified  Other symptoms and signs involving the musculoskeletal system     Problem List Patient Active Problem List   Diagnosis Date Noted  . Status post total knee replacement, right 11/18/2017  . Chronic pain of right knee 03/30/2017  . Unilateral primary osteoarthritis, right knee 03/30/2017    Maryruth Bun 12/19/2017, 3:11 PM  [facility] [address]  [address] [place], [place], [address] Phone: [phone]   Fax:  [phone]  Name: Kyle Meyers MRN: [id number] Date of Birth: [birth year]

## 2017-12-22 ENCOUNTER — Encounter: Payer: Self-pay | Admitting: Physical Therapy

## 2017-12-22 ENCOUNTER — Ambulatory Visit: Payer: Medicare Other | Admitting: Physical Therapy

## 2017-12-22 DIAGNOSIS — M25561 Pain in right knee: Secondary | ICD-10-CM

## 2017-12-22 DIAGNOSIS — R262 Difficulty in walking, not elsewhere classified: Secondary | ICD-10-CM

## 2017-12-22 DIAGNOSIS — M25661 Stiffness of right knee, not elsewhere classified: Secondary | ICD-10-CM

## 2017-12-22 DIAGNOSIS — R29898 Other symptoms and signs involving the musculoskeletal system: Secondary | ICD-10-CM

## 2017-12-22 NOTE — Therapy (Signed)
[facility] [address]  [address] [place], [place], [address] Phone: [phone]   Fax:  [phone]  Physical Therapy Treatment  Patient Details  Name: Kyle Meyers MRN: [id number] Date of Birth: 02-22-1946 Referring Provider: Doneen Poisson   Encounter Date: 12/22/2017  PT End of Session - 12/22/17 1148    Visit Number  4    Number of Visits  16    Date for PT Re-Evaluation  02/06/18    Authorization Type  Medicare    PT Start Time  1102    PT Stop Time  1203    PT Time Calculation (min)  61 min    Activity Tolerance  Patient tolerated treatment well    Behavior During Therapy  [facility] for tasks assessed/performed       Past Medical History:  Diagnosis Date  . Complication of anesthesia   . GERD (gastroesophageal reflux disease)   . History of colon polyps   . Hypercholesteremia   . Hypertension   . OA (osteoarthritis)   . Obesity   . PONV (postoperative nausea and vomiting)    Vomiting only-Pt also reported excessive gas  . Rectal bleed 09/2017  . Reflux   . Ureteral stone     Past Surgical History:  Procedure Laterality Date  . APPENDECTOMY    . BILATERAL KNEE ARTHROSCOPY    . COLONOSCOPY  09/2017  . ESOPHAGOGASTRODUODENOSCOPY    . MOUTH SURGERY    . PALATE / UVULA BIOPSY / EXCISION    . TONSILLECTOMY    . TOTAL KNEE ARTHROPLASTY Right 11/18/2017   Procedure: RIGHT TOTAL KNEE ARTHROPLASTY;  Surgeon: Kathryne Hitch, MD;  Location: [facility];  Service: Orthopedics;  Laterality: Right;    There were no vitals filed for this visit.  Subjective Assessment - 12/22/17 1104    Subjective  Patient reports things are the same as last visit. Says he went to the MD to get a physical and also got an antibiotic and cough syrup to clear up his cold.    Currently in Pain?  Yes    Pain Score  3     Pain Location  Knee    Pain Orientation  Right;Medial    Pain Descriptors / Indicators  Sharp          [facility] PT Assessment - 12/22/17 0001      PROM   Right Knee Extension  3    Right Knee Flexion  89                   [facility] Adult PT Treatment/Exercise - 12/22/17 0001      Knee/Hip Exercises: Stretches   Active Hamstring Stretch  Both;2 reps;20 seconds;Limitations    Active Hamstring Stretch Limitations  supine with strap    Gastroc Stretch  Both;20 seconds;1 rep prostretch at counter top      Knee/Hip Exercises: Aerobic   Recumbent Bike  Lvl 1 - partial revolutions 5 min      Knee/Hip Exercises: Machines for Strengthening   Cybex Leg Press  R LE's: 25# 2x15 reps       Knee/Hip Exercises: Standing   Functional Squat  1 set;10 reps    Functional Squat Limitations  TRX      Knee/Hip Exercises: Seated   Sit to Sand  10 reps;with UE support B UE hand hold; on airex      Knee/Hip Exercises: Supine   Heel Slides  Right;1 set;10 reps;Limitations  Heel Slides Limitations  with PT OP in flex and ex 3 sec hold    Straight Leg Raises  Strengthening;Right;15 reps    Straight Leg Raises Limitations  2# cues to avoid quad lag    Straight Leg Raise with External Rotation  Strengthening;Right;15 reps;Limitations    Straight Leg Raise with External Rotation Limitations  2#      Vasopneumatic   Number Minutes Vasopneumatic   15 minutes    Vasopnuematic Location   Knee    Vasopneumatic Pressure  Medium    Vasopneumatic Temperature   Lowest temp.        Manual Therapy   Manual Therapy  Joint mobilization    Manual therapy comments  Supine    Joint Mobilization  R patellar mobs in all directions                PT Short Term Goals - 12/15/17 1324      PT SHORT TERM GOAL #1   Title  Patient to be independent with initial HEP.    Time  2    Period  Weeks    Status  On-going      PT SHORT TERM GOAL #2   Title  Patient to demonstrate PROM of R knee 0-95.    Time  2    Period  Weeks    Status  On-going        PT Long Term Goals - 12/15/17 1327       PT LONG TERM GOAL #1   Title  Patient to be independent with advanced HEP.    Time  8    Period  Weeks    Status  On-going      PT LONG TERM GOAL #2   Title  Patient to demonstrate R knee AROM 0-120.    Time  8    Period  Weeks    Status  On-going      PT LONG TERM GOAL #3   Title  Patient will demonstrate SLR without quad lag in order to improve gait mechanics during ambulation.    Time  8    Period  Weeks    Status  On-going      PT LONG TERM GOAL #4   Title  Patient to demonstrate reciprocal gait pattern up/down a flight of stairs with single handrail.    Time  8    Period  Weeks    Status  On-going      PT LONG TERM GOAL #5   Title  Patient to demonstrate >=4+/5 R LE strength in order to improve standing tolerance while woodworking.     Time  8    Period  Weeks    Status  On-going            Plan - 12/22/17 1149    Clinical Impression Statement  Patient arrived to session with report that R knee is feeling okay, still having medial pain with flexion. Went to the gym on his own to try to perform revolutions on stationary bike. Patient tolerated R LE knee slides with PT OP into flexion and extension; measured PROM- flexion 89 degrees, extension 3 degrees. Able to perform leg press with R LE with 25# to encourage knee flexion. Patient tolerated progressive LE strengthening and ROM with good form and effort this date. Received Gameready to R knee; normal integumentary response at end of session.     PT Treatment/Interventions  ADLs/Self Care Home Management;Cryotherapy;Electrical Stimulation;Ultrasound;Gait training;Stair  training;Functional mobility training;Therapeutic activities;Therapeutic exercise;Balance training;Neuromuscular re-education;Patient/family education;Manual techniques;Scar mobilization;Passive range of motion;Dry needling;Taping;Vasopneumatic Device;Moist Heat    PT Next Visit Plan  Progress R knee flexion/extension ROM exercises    Consulted and Agree  with Plan of Care  Patient       Patient will benefit from skilled therapeutic intervention in order to improve the following deficits and impairments:  Abnormal gait, Decreased activity tolerance, Decreased balance, Decreased endurance, Decreased mobility, Decreased range of motion, Decreased strength, Difficulty walking, Pain  Visit Diagnosis: Acute pain of right knee  Stiffness of right knee, not elsewhere classified  Difficulty in walking, not elsewhere classified  Other symptoms and signs involving the musculoskeletal system     Problem List Patient Active Problem List   Diagnosis Date Noted  . Status post total knee replacement, right 11/18/2017  . Chronic pain of right knee 03/30/2017  . Unilateral primary osteoarthritis, right knee 03/30/2017    Anette Guarneri, PT, DPT 12/22/17 12:10 PM   [facility] Outpatient Rehabilitation [facility] [address]  [address] [place], [place], [address] Phone: [phone]   Fax:  [phone]  Name: Dalten Ambrosino MRN: [id number] Date of Birth: 1946-07-17

## 2017-12-26 ENCOUNTER — Ambulatory Visit: Payer: Medicare Other

## 2017-12-29 ENCOUNTER — Ambulatory Visit: Payer: Medicare Other

## 2017-12-29 DIAGNOSIS — M25661 Stiffness of right knee, not elsewhere classified: Secondary | ICD-10-CM

## 2017-12-29 DIAGNOSIS — R29898 Other symptoms and signs involving the musculoskeletal system: Secondary | ICD-10-CM

## 2017-12-29 DIAGNOSIS — M25561 Pain in right knee: Secondary | ICD-10-CM

## 2017-12-29 DIAGNOSIS — R262 Difficulty in walking, not elsewhere classified: Secondary | ICD-10-CM

## 2017-12-29 NOTE — Therapy (Addendum)
[facility] [address]  [place], [place], [address] Phone: [phone]   Fax:  [phone]  Physical Therapy Treatment  Patient Details  Name: Kyle Meyers MRN: [id number] Date of Birth: 22-Apr-1946 Referring Provider: Jean Rosenthal   Encounter Date: 12/29/2017  PT End of Session - 12/29/17 1110    Visit Number  5    Number of Visits  16    Date for PT Re-Evaluation  02/06/18    Authorization Type  Medicare    PT Start Time  1102    PT Stop Time  1217    PT Time Calculation (min)  75 min    Activity Tolerance  Patient tolerated treatment well    Behavior During Therapy  [facility] for tasks assessed/performed       Past Medical History:  Diagnosis Date  . Complication of anesthesia   . GERD (gastroesophageal reflux disease)   . History of colon polyps   . Hypercholesteremia   . Hypertension   . OA (osteoarthritis)   . Obesity   . PONV (postoperative nausea and vomiting)    Vomiting only-Pt also reported excessive gas  . Rectal bleed 09/2017  . Reflux   . Ureteral stone     Past Surgical History:  Procedure Laterality Date  . APPENDECTOMY    . BILATERAL KNEE ARTHROSCOPY    . COLONOSCOPY  09/2017  . ESOPHAGOGASTRODUODENOSCOPY    . MOUTH SURGERY    . PALATE / UVULA BIOPSY / EXCISION    . TONSILLECTOMY    . TOTAL KNEE ARTHROPLASTY Right 11/18/2017   Procedure: RIGHT TOTAL KNEE ARTHROPLASTY;  Surgeon: Mcarthur Rossetti, MD;  Location: [facility];  Service: Orthopedics;  Laterality: Right;    There were no vitals filed for this visit.  Subjective Assessment - 12/29/17 1109    Subjective  Pt. reporting he has not been able to ice or perform HEP this past week due to brother passing away suddeny on 5.16.      How long can you walk comfortably?  1 hour     Patient Stated Goals  wants to get to 120 degrees flexion and decrease pain and tightness in his knee    Currently in Pain?  Yes    Pain  Score  3     Pain Location  Knee    Pain Orientation  Right;Medial    Pain Descriptors / Indicators  Sharp    Pain Type  Acute pain;Surgical pain    Pain Onset  More than a month ago    Pain Frequency  Intermittent    Aggravating Factors   bending knee     Pain Relieving Factors  ice     Multiple Pain Sites  No         [facility] PT Assessment - 12/29/17 1216      Assessment   Next MD Visit  01/03/2018      PROM   PROM Assessment Site  Knee    Right/Left Knee  Right    Right Knee Extension  2    Right Knee Flexion  97 Post patellar mobs, prone quad stretch, A/P mobs       Strength   Right/Left Hip  Right;Left    Right Hip Flexion  4+/5    Right Hip External Rotation   4+/5    Right Hip ABduction  4+/5    Left Hip Flexion  5/5    Left Hip External Rotation  4+/5    Left Hip ABduction  4+/5    Right/Left Knee  Right;Left    Right Knee Flexion  5/5    Right Knee Extension  4+/5    Left Knee Flexion  4+/5    Left Knee Extension  5/5    Right/Left Ankle  Right;Left    Right Ankle Dorsiflexion  4+/5    Right Ankle Plantar Flexion  4+/5    Left Ankle Dorsiflexion  4+/5    Left Ankle Plantar Flexion  4+/5                   [facility] Adult PT Treatment/Exercise - 12/29/17 1216      Ambulation/Gait   Ambulation/Gait  No    Ambulation/Gait Assistance  7: Independent    Ambulation Distance (Feet)  100 Feet    Assistive device  None    Gait Pattern  Step-through pattern;Decreased hip/knee flexion - right    Stairs  Yes    Stairs Assistance  6: Modified independent (Device/Increase time)    Stair Management Technique  One rail Right;Alternating pattern    Height of Stairs  14    Gait Comments  Good overall gait mechanics with B heel strike and even stance time however still with slight R decrease in R hip/knee flexion; pt. demonstrating good overall gait mechanics ascending/descending stairs x 14 today with 1 rail use; performing with regular speed and "slow motion"  speed on recipocal pattern descending with pt. able to demo good quad control       Knee/Hip Exercises: Stretches   Sports administrator  2 reps;Right;60 seconds    Sports administrator Limitations  with strap     Hip Flexor Stretch  Right;2 reps;60 seconds    Hip Flexor Stretch Limitations  in mod thomas with therapist overpressur     Knee: Self-Stretch to increase Flexion  Right 5" x 10 reps on 9" stool lunge       Knee/Hip Exercises: Aerobic   Recumbent Bike  Lvl 1 - partial and full revolutions 6 min Full revolutions x 1 min following mobs, quad stretch       Knee/Hip Exercises: Standing   Functional Squat  1 set;15 reps depth to R knee flexion stretch     Functional Squat Limitations  TRX      Modalities   Modalities  Vasopneumatic      Vasopneumatic   Number Minutes Vasopneumatic   15 minutes    Vasopnuematic Location   Knee    Vasopneumatic Pressure  Medium    Vasopneumatic Temperature   Lowest temp.        Manual Therapy   Manual Therapy  Joint mobilization;Soft tissue mobilization;Passive ROM    Manual therapy comments  Supine    Joint Mobilization  R patellar mobs in all directions (still somewhat limited mobility); R knee seat belt assisted mobs in seated and lunge position on 9" stool A/P mobs for improved flexion/ext. ROM     Soft tissue mobilization  STM/strumming to R quad in mod thomas position     Passive ROM  R knee flexion stretch with therapist 5" x 10 reps              PT Education - 12/29/17 1213    Education provided  Yes    Education Details  HEP update     Person(s) Educated  Patient    Methods  Explanation;Demonstration;Verbal cues;Handout    Comprehension  Verbalized understanding;Returned demonstration;Verbal cues required;Need further  instruction       PT Short Term Goals - 12/29/17 1213      PT SHORT TERM GOAL #1   Title  Patient to be independent with initial HEP.    Time  2    Period  Weeks    Status  Achieved      PT SHORT TERM GOAL #2    Title  Patient to demonstrate PROM of R knee 0-95.    Time  2    Period  Weeks    Status  Partially Met still 2 - 97 dg PROM R        PT Long Term Goals - 12/29/17 1214      PT LONG TERM GOAL #1   Title  Patient to be independent with advanced HEP.    Time  8    Period  Weeks    Status  Partially Met Met for current HEP       PT LONG TERM GOAL #2   Title  Patient to demonstrate R knee AROM 0-120.    Time  8    Period  Weeks    Status  On-going R knee AROM 4-94 dg       PT LONG TERM GOAL #3   Title  Patient will demonstrate SLR without quad lag in order to improve gait mechanics during ambulation.    Time  8    Period  Weeks    Status  Achieved      PT LONG TERM GOAL #4   Title  Patient to demonstrate reciprocal gait pattern up/down a flight of stairs with single handrail.    Time  8    Period  Weeks    Status  Achieved      PT LONG TERM GOAL #5   Title  Patient to demonstrate >=4+/5 R LE strength in order to improve standing tolerance while woodworking.     Time  8    Period  Weeks    Status  Achieved            Plan - 12/29/17 1122    Clinical Impression Statement  Pt. seen to start session reporting brother with recent/sudden death last week.  Pt. noting he has been busy with arrangements for funeral and has not been able to ice knee or perform HEP.  Pt. demonstrating improved R knee extension ROM today and R flexion ROM improved ~ 6 dg following seat-belt assisted A/P mobs, patellar mobs, and prone quad stretching.  Pt. with visibly tight quads today likely limiting flexion ROM thus HEP updated accordingly.  Pt. able to demo good improvement in R LE strength today meeting LTG.  Pt. able to partially meet STG for PROM today.  Pt. now ambulating with good overall gait mechanics without AD with good B heel strike and even stance time.  Able to demo reciprocal stair navigation ascending/descending with good quad control and overall stability.  HEP updated and pt.  strongly encouraged to perform daily for improved flexion ROM.  Pt. progressing very well at this point in therapy and will continue to benefit from further skilled therapy to maximize flexion ROM and functional strength for improved quality of life.      PT Treatment/Interventions  ADLs/Self Care Home Management;Cryotherapy;Electrical Stimulation;Ultrasound;Gait training;Stair training;Functional mobility training;Therapeutic activities;Therapeutic exercise;Balance training;Neuromuscular re-education;Patient/family education;Manual techniques;Scar mobilization;Passive range of motion;Dry needling;Taping;Vasopneumatic Device;Moist Heat    PT Next Visit Plan  Progress R knee flexion/extension ROM exercises    Consulted and  Agree with Plan of Care  Patient       Patient will benefit from skilled therapeutic intervention in order to improve the following deficits and impairments:  Abnormal gait, Decreased activity tolerance, Decreased balance, Decreased endurance, Decreased mobility, Decreased range of motion, Decreased strength, Difficulty walking, Pain  Visit Diagnosis: Acute pain of right knee  Stiffness of right knee, not elsewhere classified  Difficulty in walking, not elsewhere classified  Other symptoms and signs involving the musculoskeletal system     Problem List Patient Active Problem List   Diagnosis Date Noted  . Status post total knee replacement, right 11/18/2017  . Chronic pain of right knee 03/30/2017  . Unilateral primary osteoarthritis, right knee 03/30/2017    Bess Harvest, PTA 12/29/17 12:47 PM  [facility] [address]  [place], [place], [address] Phone: [phone]   Fax:  [phone]  Name: Kyle Meyers MRN: [id number] Date of Birth: 15-Nov-1945

## 2018-01-03 ENCOUNTER — Ambulatory Visit: Payer: Medicare Other | Admitting: Physical Therapy

## 2018-01-03 ENCOUNTER — Encounter (INDEPENDENT_AMBULATORY_CARE_PROVIDER_SITE_OTHER): Payer: Self-pay | Admitting: Orthopaedic Surgery

## 2018-01-03 ENCOUNTER — Ambulatory Visit (INDEPENDENT_AMBULATORY_CARE_PROVIDER_SITE_OTHER): Payer: Medicare Other | Admitting: Orthopaedic Surgery

## 2018-01-03 ENCOUNTER — Encounter: Payer: Self-pay | Admitting: Physical Therapy

## 2018-01-03 DIAGNOSIS — M25661 Stiffness of right knee, not elsewhere classified: Secondary | ICD-10-CM

## 2018-01-03 DIAGNOSIS — R262 Difficulty in walking, not elsewhere classified: Secondary | ICD-10-CM

## 2018-01-03 DIAGNOSIS — M25561 Pain in right knee: Secondary | ICD-10-CM

## 2018-01-03 DIAGNOSIS — Z96651 Presence of right artificial knee joint: Secondary | ICD-10-CM

## 2018-01-03 DIAGNOSIS — R29898 Other symptoms and signs involving the musculoskeletal system: Secondary | ICD-10-CM

## 2018-01-03 NOTE — Progress Notes (Signed)
Kyle Meyers is following up in 6 weeks now status post a right total knee arthroplasty.  He is been in the therapy now for about 2 weeks as an outpatient and reports he is doing well.  He has some sharp pains around his incision from time to time but overall is making progress.  Unfortunately her brother passed away suddenly from accident recently and aunts taken a toll on him.  On exam he does still have a moderate effusion of his right knee.  His incision looks good other than a slight abrasion that he said he picked up some dry skin.  There is no evidence of infection.  He has full extension to about 100 degrees flexion.  At this point continue outpatient physical therapy to work on aggressive motion.  He can place just some ointment such as Neosporin on his knee incision if needed.  We will see him back in a month see how he is doing overall but no x-rays are needed.

## 2018-01-03 NOTE — Therapy (Signed)
[facility] [address]  [name], [place], [address] Phone: [phone]   Fax:  [phone]  Physical Therapy Treatment  Patient Details  Name: Kyle Meyers MRN: [id number] Date of Birth: 12/16/1945 Referring Provider: Jean Rosenthal   Encounter Date: 01/03/2018  PT End of Session - 01/03/18 1449    Visit Number  6    Number of Visits  16    Date for PT Re-Evaluation  02/06/18    Authorization Type  Medicare    PT Start Time  1401    PT Stop Time  1456    PT Time Calculation (min)  55 min    Activity Tolerance  Patient tolerated treatment well    Behavior During Therapy  [facility] for tasks assessed/performed       Past Medical History:  Diagnosis Date  . Complication of anesthesia   . GERD (gastroesophageal reflux disease)   . History of colon polyps   . Hypercholesteremia   . Hypertension   . OA (osteoarthritis)   . Obesity   . PONV (postoperative nausea and vomiting)    Vomiting only-Pt also reported excessive gas  . Rectal bleed 09/2017  . Reflux   . Ureteral stone     Past Surgical History:  Procedure Laterality Date  . APPENDECTOMY    . BILATERAL KNEE ARTHROSCOPY    . COLONOSCOPY  09/2017  . ESOPHAGOGASTRODUODENOSCOPY    . MOUTH SURGERY    . PALATE / UVULA BIOPSY / EXCISION    . TONSILLECTOMY    . TOTAL KNEE ARTHROPLASTY Right 11/18/2017   Procedure: RIGHT TOTAL KNEE ARTHROPLASTY;  Surgeon: Mcarthur Rossetti, MD;  Location: [facility];  Service: Orthopedics;  Laterality: Right;    There were no vitals filed for this visit.  Subjective Assessment - 01/03/18 1403    Subjective  Patient reports he has been up on his knee quite a bit lately. He has been working in the back yard. Had an appointment with MD this AM; said MD was pleased and needs to work more on his mobility.    Patient Stated Goals  wants to get to 120 degrees flexion and decrease pain and tightness in his knee    Currently in Pain?  Yes    Pain Score  3     Pain Location  Knee    Pain Orientation  Right;Medial    Pain Type  Acute pain                       [facility] Adult PT Treatment/Exercise - 01/03/18 0001      Knee/Hip Exercises: Stretches   Sports administrator  2 reps;Right;60 seconds    Quad Stretch Limitations  with strap; PT OP to hip to avoid compensation    Hip Flexor Stretch  Right;2 reps;30 seconds    Hip Flexor Stretch Limitations  in mod thomas with therapist overpressur       Knee/Hip Exercises: Aerobic   Recumbent Bike  Lvl 1 - partial revolutions 6 min      Knee/Hip Exercises: Machines for Strengthening   Cybex Leg Press  R LE's: 35# 2x15 reps       Knee/Hip Exercises: Standing   Functional Squat  1 set;15 reps depth to R knee flexion stretch     Functional Squat Limitations  TRX; VC/TCs to increase weight shift on R LE      Knee/Hip Exercises: Supine   Heel Slides  Right;1 set;10 reps;Limitations    Heel Slides Limitations  with PT OP in flex and ex 3 sec hold      Vasopneumatic   Number Minutes Vasopneumatic   15 minutes    Vasopnuematic Location   Knee    Vasopneumatic Pressure  Medium    Vasopneumatic Temperature   Lowest temp.        Manual Therapy   Manual Therapy  Joint mobilization    Manual therapy comments  Supine    Joint Mobilization  R patellar mobs in all directions     Soft tissue mobilization  STM to distal thigh               PT Short Term Goals - 12/29/17 1213      PT SHORT TERM GOAL #1   Title  Patient to be independent with initial HEP.    Time  2    Period  Weeks    Status  Achieved      PT SHORT TERM GOAL #2   Title  Patient to demonstrate PROM of R knee 0-95.    Time  2    Period  Weeks    Status  Partially Met still 2 - 97 dg PROM R        PT Long Term Goals - 12/29/17 1214      PT LONG TERM GOAL #1   Title  Patient to be independent with advanced HEP.    Time  8    Period  Weeks    Status  Partially Met  Met for current HEP       PT LONG TERM GOAL #2   Title  Patient to demonstrate R knee AROM 0-120.    Time  8    Period  Weeks    Status  On-going R knee AROM 4-94 dg       PT LONG TERM GOAL #3   Title  Patient will demonstrate SLR without quad lag in order to improve gait mechanics during ambulation.    Time  8    Period  Weeks    Status  Achieved      PT LONG TERM GOAL #4   Title  Patient to demonstrate reciprocal gait pattern up/down a flight of stairs with single handrail.    Time  8    Period  Weeks    Status  Achieved      PT LONG TERM GOAL #5   Title  Patient to demonstrate >=4+/5 R LE strength in order to improve standing tolerance while woodworking.     Time  8    Period  Weeks    Status  Achieved            Plan - 01/03/18 1450    Clinical Impression Statement  Patient arrived to session with report that he has been on his feet a lot recently. Has not iced it as much as he should have been. Reports he has no swelling in the AM, which increases as day goes on. Small scab in center of R knee on scar this date- patient reports he had a bit of dry skin on it that he took off. Tolerated STM to R distal quad, ITB, and medial knee. Report of tenderness in ITB and distal quad. Encouraged patient to continue icing R knee, massaging R quad to tolerance, and self-scar massage to areas of scar that are well healed. Patient tolerated mat and standing ther-ex to encourage R  knee flexion without report of pain. Received Gameready to R knee at end of session- normal integumentary response noted.     PT Treatment/Interventions  ADLs/Self Care Home Management;Cryotherapy;Electrical Stimulation;Ultrasound;Gait training;Stair training;Functional mobility training;Therapeutic activities;Therapeutic exercise;Balance training;Neuromuscular re-education;Patient/family education;Manual techniques;Scar mobilization;Passive range of motion;Dry needling;Taping;Vasopneumatic Device;Moist Heat    PT  Next Visit Plan  Progress R knee flexion/extension ROM exercises    Consulted and Agree with Plan of Care  Patient       Patient will benefit from skilled therapeutic intervention in order to improve the following deficits and impairments:  Abnormal gait, Decreased activity tolerance, Decreased balance, Decreased endurance, Decreased mobility, Decreased range of motion, Decreased strength, Difficulty walking, Pain  Visit Diagnosis: Acute pain of right knee  Stiffness of right knee, not elsewhere classified  Difficulty in walking, not elsewhere classified  Other symptoms and signs involving the musculoskeletal system     Problem List Patient Active Problem List   Diagnosis Date Noted  . Status post total knee replacement, right 11/18/2017  . Chronic pain of right knee 03/30/2017  . Unilateral primary osteoarthritis, right knee 03/30/2017    Janene Harvey, PT, DPT 01/03/18 2:59 PM  [facility] [address]  [place], [place], [address] Phone: [phone]   Fax:  [phone]  Name: Chief Walkup MRN: [id number] Date of Birth: 1945-11-04

## 2018-01-05 ENCOUNTER — Encounter: Payer: Self-pay | Admitting: Physical Therapy

## 2018-01-05 ENCOUNTER — Ambulatory Visit: Payer: Medicare Other | Admitting: Physical Therapy

## 2018-01-05 DIAGNOSIS — M25561 Pain in right knee: Secondary | ICD-10-CM

## 2018-01-05 DIAGNOSIS — R29898 Other symptoms and signs involving the musculoskeletal system: Secondary | ICD-10-CM

## 2018-01-05 DIAGNOSIS — R262 Difficulty in walking, not elsewhere classified: Secondary | ICD-10-CM

## 2018-01-05 DIAGNOSIS — M25661 Stiffness of right knee, not elsewhere classified: Secondary | ICD-10-CM

## 2018-01-05 NOTE — Therapy (Signed)
[facility] [address]  [place], [place], [address] Phone: [phone]   Fax:  [phone]  Physical Therapy Treatment  Patient Details  Name: Kyle Meyers MRN: [id number] Date of Birth: [birth year] Referring Provider: Jean Rosenthal   Encounter Date: 01/05/2018  PT End of Session - 01/05/18 1152    Visit Number  7    Number of Visits  16    Date for PT Re-Evaluation  02/06/18    Authorization Type  Medicare    PT Start Time  1106    PT Stop Time  1157    PT Time Calculation (min)  51 min    Activity Tolerance  Patient tolerated treatment well    Behavior During Therapy  [facility] for tasks assessed/performed       Past Medical History:  Diagnosis Date  . Complication of anesthesia   . GERD (gastroesophageal reflux disease)   . History of colon polyps   . Hypercholesteremia   . Hypertension   . OA (osteoarthritis)   . Obesity   . PONV (postoperative nausea and vomiting)    Vomiting only-Pt also reported excessive gas  . Rectal bleed 09/2017  . Reflux   . Ureteral stone     Past Surgical History:  Procedure Laterality Date  . APPENDECTOMY    . BILATERAL KNEE ARTHROSCOPY    . COLONOSCOPY  09/2017  . ESOPHAGOGASTRODUODENOSCOPY    . MOUTH SURGERY    . PALATE / UVULA BIOPSY / EXCISION    . TONSILLECTOMY    . TOTAL KNEE ARTHROPLASTY Right 11/18/2017   Procedure: RIGHT TOTAL KNEE ARTHROPLASTY;  Surgeon: Mcarthur Rossetti, MD;  Location: [facility];  Service: Orthopedics;  Laterality: Right;    There were no vitals filed for this visit.  Subjective Assessment - 01/05/18 1107    Subjective  Patient reports R knee is getting better every day. Reports some swelling last night and he iced it.     Patient Stated Goals  wants to get to 120 degrees flexion and decrease pain and tightness in his knee    Currently in Pain?  No/denies    Pain Score  0-No pain         [facility] PT Assessment - 01/05/18  0001      AROM   AROM Assessment Site  Knee    Right/Left Knee  Right    Right Knee Extension  3    Right Knee Flexion  97      PROM   PROM Assessment Site  Knee    Right/Left Knee  Right    Right Knee Extension  3    Right Knee Flexion  104                   [facility] Adult PT Treatment/Exercise - 01/05/18 0001      Knee/Hip Exercises: Stretches   Sports administrator  2 reps;Right;30 seconds    Sports administrator Limitations  with strap    Hip Flexor Stretch  Right;2 reps;30 seconds    Hip Flexor Stretch Limitations  in mod thomas with therapist overpressur       Knee/Hip Exercises: Aerobic   Recumbent Bike  Level 1; complete revolutions at end of session      Knee/Hip Exercises: Standing   Functional Squat  1 set;15 reps depth to R knee flexion stretch     Functional Squat Limitations  TRX; VC/TCs to increase weight shift on R LE  Knee/Hip Exercises: Seated   Sit to Sand  1 set;with UE support blue TB around R torso to encourage R weight shift      Knee/Hip Exercises: Supine   Heel Slides  Right;1 set;10 reps;Limitations    Heel Slides Limitations  with PT OP in flex and ex 3 sec hold      Modalities   Modalities  Vasopneumatic      Vasopneumatic   Number Minutes Vasopneumatic   10 minutes    Vasopnuematic Location   Knee    Vasopneumatic Pressure  Medium    Vasopneumatic Temperature   Lowest temp.        Manual Therapy   Manual Therapy  Other (comment);Soft tissue mobilization;Joint mobilization scar tissue mobilization    Manual therapy comments  Supine    Joint Mobilization  seat belt mobs to R knee with distraction; grade IV    Soft tissue mobilization  STM to distal thigh    Other Manual Therapy  scar tissue mobilization to R knee, avoiding scab               PT Short Term Goals - 12/29/17 1213      PT SHORT TERM GOAL #1   Title  Patient to be independent with initial HEP.    Time  2    Period  Weeks    Status  Achieved      PT SHORT TERM  GOAL #2   Title  Patient to demonstrate PROM of R knee 0-95.    Time  2    Period  Weeks    Status  Partially Met still 2 - 97 dg PROM R        PT Long Term Goals - 12/29/17 1214      PT LONG TERM GOAL #1   Title  Patient to be independent with advanced HEP.    Time  8    Period  Weeks    Status  Partially Met Met for current HEP       PT LONG TERM GOAL #2   Title  Patient to demonstrate R knee AROM 0-120.    Time  8    Period  Weeks    Status  On-going R knee AROM 4-94 dg       PT LONG TERM GOAL #3   Title  Patient will demonstrate SLR without quad lag in order to improve gait mechanics during ambulation.    Time  8    Period  Weeks    Status  Achieved      PT LONG TERM GOAL #4   Title  Patient to demonstrate reciprocal gait pattern up/down a flight of stairs with single handrail.    Time  8    Period  Weeks    Status  Achieved      PT LONG TERM GOAL #5   Title  Patient to demonstrate >=4+/5 R LE strength in order to improve standing tolerance while woodworking.     Time  8    Period  Weeks    Status  Achieved            Plan - 01/05/18 1152    Clinical Impression Statement  Patient arrived to appointment with no new complaints, reports he is able to walk and climb stairs without pain. Medial knee pain has started to dissipate. Tolerated STM to R distal thigh and scar massage to R knee, avoiding scab on patella. Patient tolerated heel slide  with PT OP to tolerance, as well as hip flexor and quad stretches to improve knee flexion tolerance. Tolerated seating grade IV seat belt joint mobilizations to R knee with visible improvement in motion. Measured R knee- PROM 3 degrees extension, 104 degrees flexion. Patient able to complete full revolutions on bike at end of session. Received Gameready to R knee at end of session; normal integumentary response observed.     PT Treatment/Interventions  ADLs/Self Care Home Management;Cryotherapy;Electrical  Stimulation;Ultrasound;Gait training;Stair training;Functional mobility training;Therapeutic activities;Therapeutic exercise;Balance training;Neuromuscular re-education;Patient/family education;Manual techniques;Scar mobilization;Passive range of motion;Dry needling;Taping;Vasopneumatic Device;Moist Heat    PT Next Visit Plan  Progress R knee flexion/extension ROM exercises    Consulted and Agree with Plan of Care  Patient       Patient will benefit from skilled therapeutic intervention in order to improve the following deficits and impairments:  Abnormal gait, Decreased activity tolerance, Decreased balance, Decreased endurance, Decreased mobility, Decreased range of motion, Decreased strength, Difficulty walking, Pain  Visit Diagnosis: Acute pain of right knee  Stiffness of right knee, not elsewhere classified  Difficulty in walking, not elsewhere classified  Other symptoms and signs involving the musculoskeletal system     Problem List Patient Active Problem List   Diagnosis Date Noted  . Status post total knee replacement, right 11/18/2017  . Chronic pain of right knee 03/30/2017  . Unilateral primary osteoarthritis, right knee 03/30/2017    Janene Harvey, PT, DPT 01/05/18 11:58 AM   [facility] [address]  [place], [place], [address] Phone: [phone]   Fax:  [phone]  Name: Kyle Meyers MRN: [id number] Date of Birth: 1945/08/21

## 2018-01-09 ENCOUNTER — Ambulatory Visit: Payer: Medicare Other | Attending: Orthopaedic Surgery

## 2018-01-09 DIAGNOSIS — M25661 Stiffness of right knee, not elsewhere classified: Secondary | ICD-10-CM | POA: Insufficient documentation

## 2018-01-09 DIAGNOSIS — R29898 Other symptoms and signs involving the musculoskeletal system: Secondary | ICD-10-CM | POA: Diagnosis present

## 2018-01-09 DIAGNOSIS — R262 Difficulty in walking, not elsewhere classified: Secondary | ICD-10-CM | POA: Diagnosis present

## 2018-01-09 DIAGNOSIS — M25561 Pain in right knee: Secondary | ICD-10-CM

## 2018-01-09 NOTE — Therapy (Signed)
[facility] [address]  [place], [place], [address] Phone: [phone]   Fax:  [phone]  Physical Therapy Treatment  Patient Details  Name: Kyle Meyers MRN: [id number] Date of Birth: Mar 21, 1946 Referring Provider: Jean Rosenthal   Encounter Date: 01/09/2018  PT End of Session - 01/09/18 1115    Visit Number  8    Number of Visits  16    Date for PT Re-Evaluation  02/06/18    Authorization Type  Medicare    PT Start Time  1106    PT Stop Time  1201    PT Time Calculation (min)  55 min    Activity Tolerance  Patient tolerated treatment well    Behavior During Therapy  [facility] for tasks assessed/performed       Past Medical History:  Diagnosis Date  . Complication of anesthesia   . GERD (gastroesophageal reflux disease)   . History of colon polyps   . Hypercholesteremia   . Hypertension   . OA (osteoarthritis)   . Obesity   . PONV (postoperative nausea and vomiting)    Vomiting only-Pt also reported excessive gas  . Rectal bleed 09/2017  . Reflux   . Ureteral stone     Past Surgical History:  Procedure Laterality Date  . APPENDECTOMY    . BILATERAL KNEE ARTHROSCOPY    . COLONOSCOPY  09/2017  . ESOPHAGOGASTRODUODENOSCOPY    . MOUTH SURGERY    . PALATE / UVULA BIOPSY / EXCISION    . TONSILLECTOMY    . TOTAL KNEE ARTHROPLASTY Right 11/18/2017   Procedure: RIGHT TOTAL KNEE ARTHROPLASTY;  Surgeon: Mcarthur Rossetti, MD;  Location: [facility];  Service: Orthopedics;  Laterality: Right;    There were no vitals filed for this visit.  Subjective Assessment - 01/09/18 1109    Subjective  Pt. noting he is still icein 1x/day.  Feels the knee is improving.      Patient Stated Goals  wants to get to 120 degrees flexion and decrease pain and tightness in his knee    Currently in Pain?  No/denies    Pain Score  0-No pain    Multiple Pain Sites  No                       [facility] Adult  PT Treatment/Exercise - 01/09/18 1116      Knee/Hip Exercises: Stretches   Sports administrator  2 reps;Right;60 seconds    Sports administrator Limitations  with strap    Hip Flexor Stretch  Right;2 reps;30 seconds    Hip Flexor Stretch Limitations  in mod thomas with therapist overpressur       Knee/Hip Exercises: Aerobic   Recumbent Bike  Level 1; complete revolutions at end of session      Knee/Hip Exercises: Machines for Strengthening   Cybex Knee Extension  B con/R ecc 20# x 15 reps     Cybex Knee Flexion  B con/R ecc 25# x 15 reps     Cybex Leg Press  B LE's 40# x 15 reps       Knee/Hip Exercises: Standing   Lateral Step Up  Right;10 reps;Step Height: 8";Hand Hold: 0    Forward Step Up  Right;10 reps;Step Height: 8";Hand Hold: 0    Step Down  Right;10 reps;Hand Hold: 2;Step Height: 6"      Manual Therapy   Manual Therapy  Other (comment);Soft tissue mobilization;Joint mobilization  Manual therapy comments  Supine    Joint Mobilization  seat belt mobs to R knee for improved ROM     Soft tissue mobilization  STM/strumming with roller stick to R quad in mod thomas position                PT Short Term Goals - 12/29/17 1213      PT SHORT TERM GOAL #1   Title  Patient to be independent with initial HEP.    Time  2    Period  Weeks    Status  Achieved      PT SHORT TERM GOAL #2   Title  Patient to demonstrate PROM of R knee 0-95.    Time  2    Period  Weeks    Status  Partially Met still 2 - 97 dg PROM R        PT Long Term Goals - 12/29/17 1214      PT LONG TERM GOAL #1   Title  Patient to be independent with advanced HEP.    Time  8    Period  Weeks    Status  Partially Met Met for current HEP       PT LONG TERM GOAL #2   Title  Patient to demonstrate R knee AROM 0-120.    Time  8    Period  Weeks    Status  On-going R knee AROM 4-94 dg       PT LONG TERM GOAL #3   Title  Patient will demonstrate SLR without quad lag in order to improve gait mechanics during  ambulation.    Time  8    Period  Weeks    Status  Achieved      PT LONG TERM GOAL #4   Title  Patient to demonstrate reciprocal gait pattern up/down a flight of stairs with single handrail.    Time  8    Period  Weeks    Status  Achieved      PT LONG TERM GOAL #5   Title  Patient to demonstrate >=4+/5 R LE strength in order to improve standing tolerance while woodworking.     Time  8    Period  Weeks    Status  Achieved            Plan - 01/09/18 1125    Clinical Impression Statement  Kyle Meyers doing well today with no new complaints.  Tolerated progression of standing strengthening and ROM activities well today.  Focused on therex to improve R knee flexion ROM today, which was tolerated well.  Ended session with ice/compression to R knee to decrease post-exercise swelling and pain.    PT Treatment/Interventions  ADLs/Self Care Home Management;Cryotherapy;Electrical Stimulation;Ultrasound;Gait training;Stair training;Functional mobility training;Therapeutic activities;Therapeutic exercise;Balance training;Neuromuscular re-education;Patient/family education;Manual techniques;Scar mobilization;Passive range of motion;Dry needling;Taping;Vasopneumatic Device;Moist Heat    PT Next Visit Plan  Progress R knee flexion/extension ROM exercises    Consulted and Agree with Plan of Care  Patient       Patient will benefit from skilled therapeutic intervention in order to improve the following deficits and impairments:  Abnormal gait, Decreased activity tolerance, Decreased balance, Decreased endurance, Decreased mobility, Decreased range of motion, Decreased strength, Difficulty walking, Pain  Visit Diagnosis: Acute pain of right knee  Stiffness of right knee, not elsewhere classified  Difficulty in walking, not elsewhere classified  Other symptoms and signs involving the musculoskeletal system     Problem List Patient  Active Problem List   Diagnosis Date Noted  . Status post total  knee replacement, right 11/18/2017  . Chronic pain of right knee 03/30/2017  . Unilateral primary osteoarthritis, right knee 03/30/2017    Kyle Meyers, PTA 01/09/18 12:10 PM  [facility] [address]  [place], [place], [address] Phone: [phone]   Fax:  [phone]  Name: Rusty Villella MRN: [id number] Date of Birth: 07-23-1946

## 2018-01-12 ENCOUNTER — Ambulatory Visit: Payer: Medicare Other | Admitting: Physical Therapy

## 2018-01-12 ENCOUNTER — Encounter: Payer: Self-pay | Admitting: Physical Therapy

## 2018-01-12 DIAGNOSIS — M25561 Pain in right knee: Secondary | ICD-10-CM | POA: Diagnosis not present

## 2018-01-12 DIAGNOSIS — R29898 Other symptoms and signs involving the musculoskeletal system: Secondary | ICD-10-CM

## 2018-01-12 DIAGNOSIS — R262 Difficulty in walking, not elsewhere classified: Secondary | ICD-10-CM

## 2018-01-12 DIAGNOSIS — M25661 Stiffness of right knee, not elsewhere classified: Secondary | ICD-10-CM

## 2018-01-12 NOTE — Therapy (Signed)
[facility] [address]  [place], [place], [address] Phone: [phone]   Fax:  [phone]  Physical Therapy Discharge  Patient Details  Name: Kyle Meyers MRN: [id number] Date of Birth: [birth year] Referring Provider: Jean Rosenthal   Progress Note Reporting Period 12/12/17 to 01/12/18  See note below for Objective Data and Assessment of Progress/Goals.   Encounter Date: 01/12/2018     PT End of Session - 01/12/18 1207    Visit Number  9    Number of Visits  16    Date for PT Re-Evaluation  02/06/18    Authorization Type  Medicare    PT Start Time  1051    PT Stop Time  1148    PT Time Calculation (min)  57 min    Activity Tolerance  Patient tolerated treatment well    Behavior During Therapy  [facility] for tasks assessed/performed       Past Medical History:  Diagnosis Date  . Complication of anesthesia   . GERD (gastroesophageal reflux disease)   . History of colon polyps   . Hypercholesteremia   . Hypertension   . OA (osteoarthritis)   . Obesity   . PONV (postoperative nausea and vomiting)    Vomiting only-Pt also reported excessive gas  . Rectal bleed 09/2017  . Reflux   . Ureteral stone     Past Surgical History:  Procedure Laterality Date  . APPENDECTOMY    . BILATERAL KNEE ARTHROSCOPY    . COLONOSCOPY  09/2017  . ESOPHAGOGASTRODUODENOSCOPY    . MOUTH SURGERY    . PALATE / UVULA BIOPSY / EXCISION    . TONSILLECTOMY    . TOTAL KNEE ARTHROPLASTY Right 11/18/2017   Procedure: RIGHT TOTAL KNEE ARTHROPLASTY;  Surgeon: Mcarthur Rossetti, MD;  Location: [facility];  Service: Orthopedics;  Laterality: Right;    There were no vitals filed for this visit.  Subjective Assessment - 01/12/18 1053    Subjective  Reports he is back to work and it gives him something to do. Reports 80% improvement since initial eval.    Patient Stated Goals  wants to get to 120 degrees flexion and decrease  pain and tightness in his knee    Currently in Pain?  No/denies         [facility] PT Assessment - 01/12/18 0001      Observation/Other Assessments   Focus on Therapeutic Outcomes (FOTO)   Knee: 75 (25% limited)      AROM   Right Knee Extension  3    Right Knee Flexion  100 106 after joint mobilizations      PROM   Right Knee Extension  2    Right Knee Flexion  105 107 after joint mobs                   [facility] Adult PT Treatment/Exercise - 01/12/18 0001      Knee/Hip Exercises: Stretches   Sports administrator  2 reps;Right;60 seconds    Quad Stretch Limitations  with strap    Hip Flexor Stretch  Right;2 reps;30 seconds    Hip Flexor Stretch Limitations  in mod thomas with therapist overpressur       Knee/Hip Exercises: Aerobic   Recumbent Bike  L3x6 min complete revolutions      Knee/Hip Exercises: Standing   Lateral Step Up  Right;10 reps;Step Height: 8";Hand Hold: 0    Forward Step Up  Right;10 reps;Step Height:  8";Hand Hold: 0    Step Down  Right;10 reps;Step Height: 4" CGA/min A for balance      Vasopneumatic   Number Minutes Vasopneumatic   10 minutes    Vasopnuematic Location   Knee    Vasopneumatic Pressure  Medium    Vasopneumatic Temperature   Lowest temp.        Manual Therapy   Manual Therapy  Joint mobilization;Soft tissue mobilization    Manual therapy comments  Supine/seated    Joint Mobilization  seat belt mobs to R knee for improved ROM     Soft tissue mobilization  STM to distal thigh and medial HS- TTP to HS    Other Manual Therapy  scar tissue mobilization to R knee, avoiding scab             PT Education - 01/12/18 1146    Education provided  Yes    Education Details  addition to [employer]) Educated  Patient    Methods  Explanation;Demonstration;Tactile cues;Verbal cues;Handout    Comprehension  Verbalized understanding;Returned demonstration       PT Short Term Goals - 01/12/18 1058      PT SHORT TERM GOAL #1   Title   Patient to be independent with initial HEP.    Time  2    Period  Weeks    Status  Achieved      PT SHORT TERM GOAL #2   Title  Patient to demonstrate PROM of R knee 0-95.    Time  2    Period  Weeks    Status  Partially Met still 2 - 97 dg PROM R        PT Long Term Goals - 01/12/18 1149      PT LONG TERM GOAL #1   Title  Patient to be independent with advanced HEP.    Time  8    Period  Weeks    Status  Achieved Met for current HEP       PT LONG TERM GOAL #2   Title  Patient to demonstrate R knee AROM 0-120.    Time  8    Period  Weeks    Status  Not Met R knee AROM 3-106      PT LONG TERM GOAL #3   Title  Patient will demonstrate SLR without quad lag in order to improve gait mechanics during ambulation.    Time  8    Period  Weeks    Status  Achieved      PT LONG TERM GOAL #4   Title  Patient to demonstrate reciprocal gait pattern up/down a flight of stairs with single handrail.    Time  8    Period  Weeks    Status  Achieved      PT LONG TERM GOAL #5   Title  Patient to demonstrate >=4+/5 R LE strength in order to improve standing tolerance while woodworking.     Time  8    Period  Weeks    Status  Achieved            Plan - 01/12/18 1153    Clinical Impression Statement  Patient reporting 80% improvement since initial eval.  Reports he has seen considerable improvement in pain, LE strength, and functional ability such as walking, stair climbing, and getting in/out of his tub. Is also able to tolerate prolonged standing and building at work without any issues. Patient has  met all goals except for R knee AROM; measured today after joint mobilizations- 3-106 degrees. Today tolerated STM to R quad and medial HS- soft tissue restriction and TTP in medial HS this date but tolerable. Patient performed quad and HS stretching with patient OP as tolerated. Able to perform anterior and lateral step ups and step downs with good eccentric control- muscle fatigue and mild  instability at end reps of step downs noted. Received [place] to R knee at end of session- normal integumentary response observed. Patient received and educated on advanced HEP handout; reported understanding. Patient to be D/C'd at this time d/t meeting nearly all goals and being independent with HEP- also planning to return to gym for general fitness.     PT Treatment/Interventions  ADLs/Self Care Home Management;Cryotherapy;Electrical Stimulation;Ultrasound;Gait training;Stair training;Functional mobility training;Therapeutic activities;Therapeutic exercise;Balance training;Neuromuscular re-education;Patient/family education;Manual techniques;Scar mobilization;Passive range of motion;Dry needling;Taping;Vasopneumatic Device;Moist Heat    PT Next Visit Plan  D/C'd at this time    Consulted and Agree with Plan of Care  Patient       Patient will benefit from skilled therapeutic intervention in order to improve the following deficits and impairments:  Abnormal gait, Decreased activity tolerance, Decreased balance, Decreased endurance, Decreased mobility, Decreased range of motion, Decreased strength, Difficulty walking, Pain  Visit Diagnosis: Acute pain of right knee  Stiffness of right knee, not elsewhere classified  Difficulty in walking, not elsewhere classified  Other symptoms and signs involving the musculoskeletal system     Problem List Patient Active Problem List   Diagnosis Date Noted  . Status post total knee replacement, right 11/18/2017  . Chronic pain of right knee 03/30/2017  . Unilateral primary osteoarthritis, right knee 03/30/2017    PHYSICAL THERAPY DISCHARGE SUMMARY  Visits from Start of Care: 9  Current functional level related to goals / functional outcomes: See above clinical impression.   Remaining deficits: Decreased R knee ROM   Education / Equipment: Advanced HEP- educated on and given handout Plan: Patient agrees to discharge.  Patient goals were  partially met. Patient is being discharged due to being pleased with the current functional level.  ?????      Janene Harvey, PT, DPT 01/12/18 12:09 PM  [facility] [address]  [place], [place], [address] Phone: [phone]   Fax:  [phone]  Name: Tlaloc Taddei MRN: [id number] Date of Birth: [birth year]

## 2018-01-16 ENCOUNTER — Ambulatory Visit: Payer: Medicare Other

## 2018-01-18 ENCOUNTER — Ambulatory Visit: Payer: Medicare Other

## 2018-01-19 ENCOUNTER — Ambulatory Visit: Payer: Medicare Other | Admitting: Physical Therapy

## 2018-01-23 ENCOUNTER — Ambulatory Visit: Payer: Medicare Other

## 2018-01-26 ENCOUNTER — Ambulatory Visit: Payer: Medicare Other | Admitting: Physical Therapy

## 2018-02-07 ENCOUNTER — Ambulatory Visit (INDEPENDENT_AMBULATORY_CARE_PROVIDER_SITE_OTHER): Payer: Medicare Other | Admitting: Orthopaedic Surgery

## 2018-02-15 ENCOUNTER — Ambulatory Visit (INDEPENDENT_AMBULATORY_CARE_PROVIDER_SITE_OTHER): Payer: Medicare Other | Admitting: Orthopaedic Surgery

## 2019-10-29 IMAGING — DX DG KNEE 1-2V PORT*R*
2 series · 2 of 2 positions shown · non-contrast
Comparison: None.

CLINICAL DATA: Postop total right knee replacement

EXAM:
PORTABLE RIGHT KNEE - 1-2 VIEW

[knee ap]
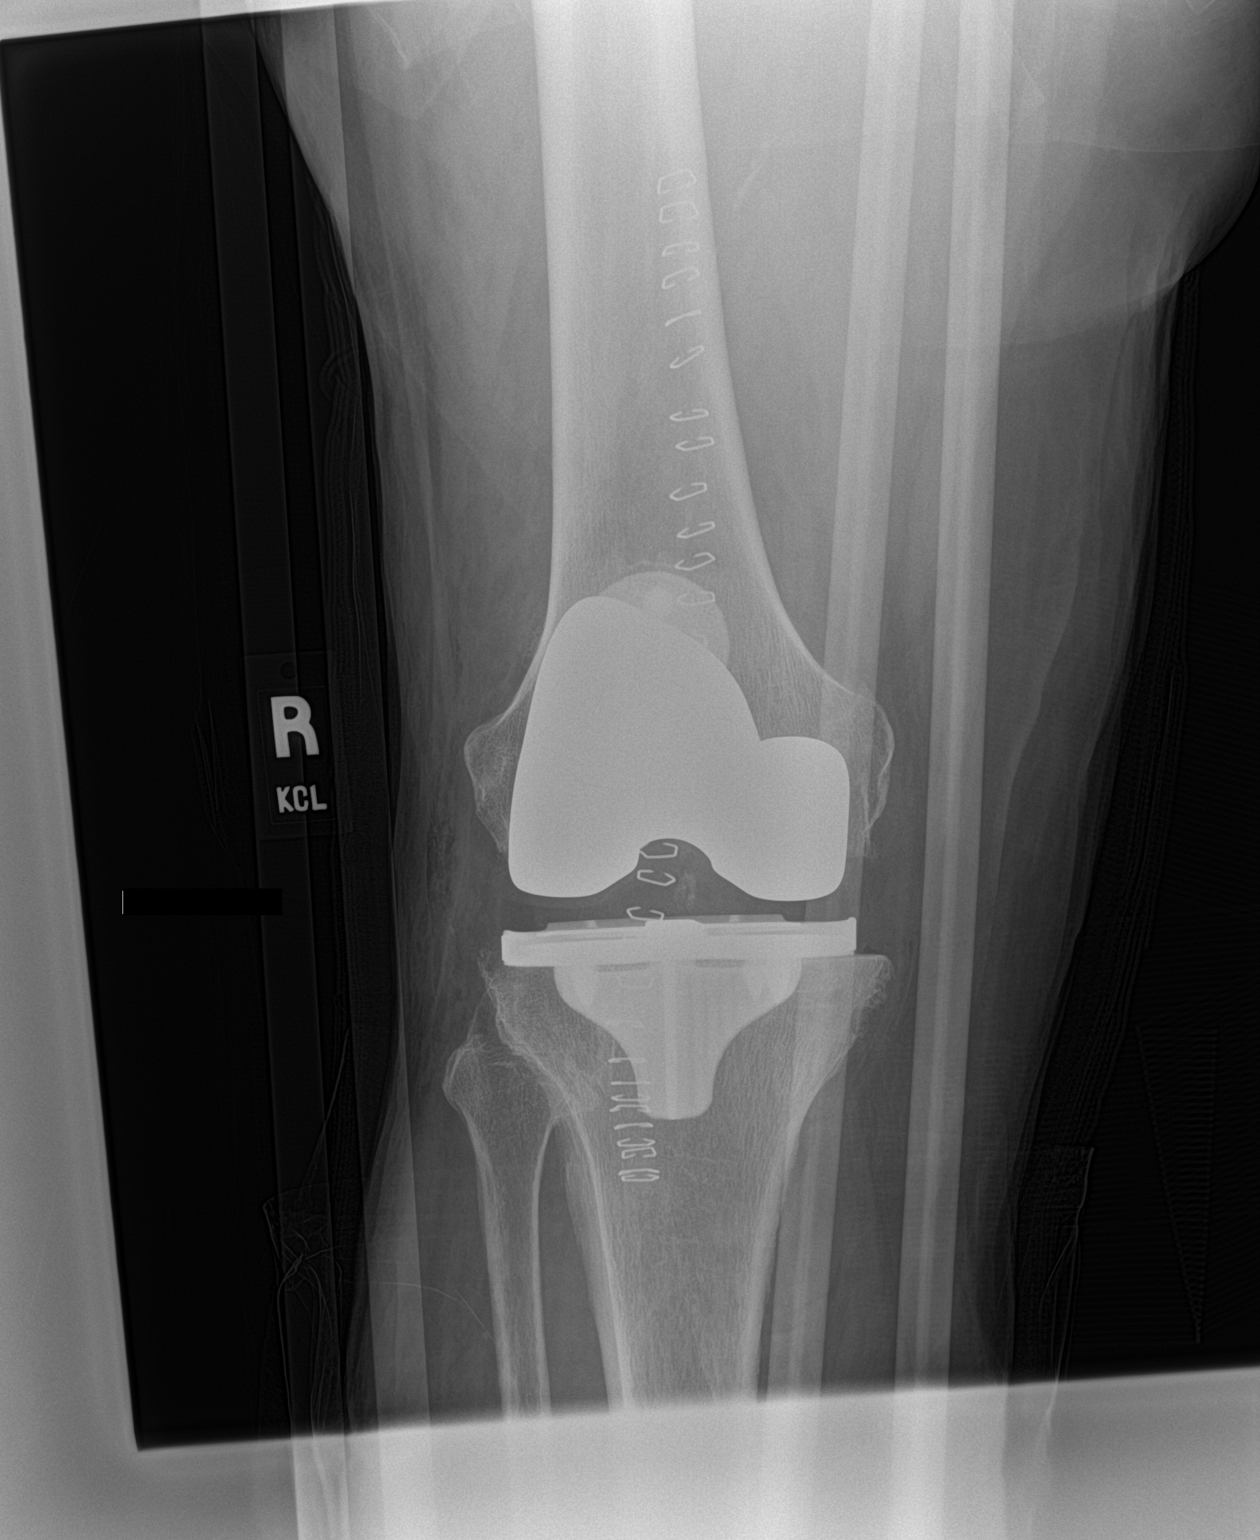

[knee lat]
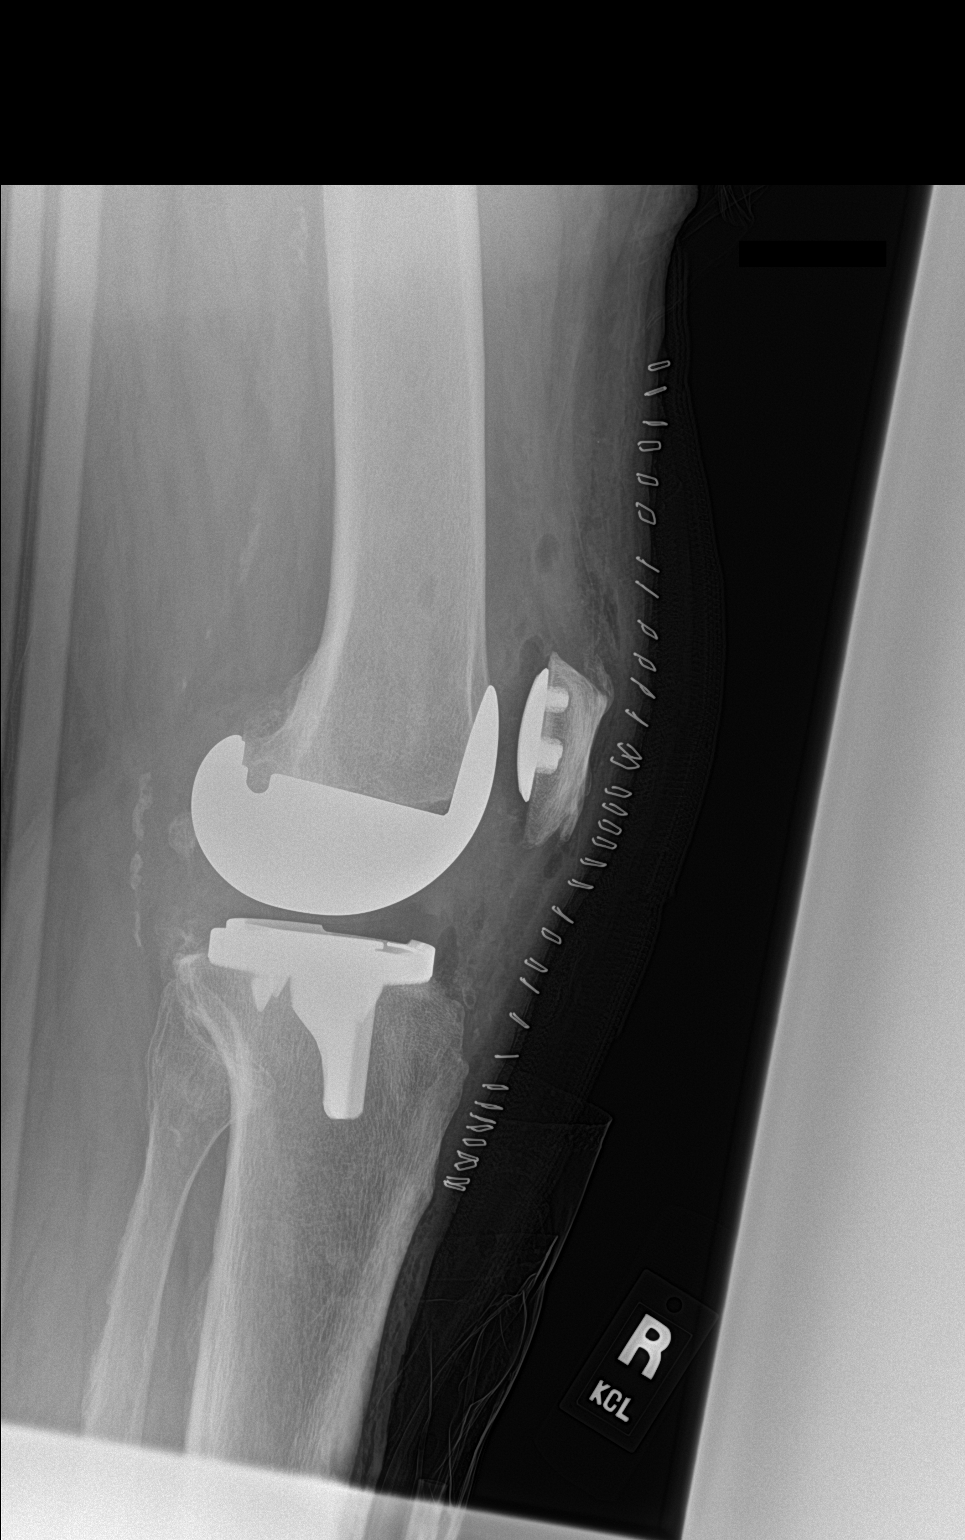

[2 of 2 positions shown; findings below may reference images not displayed]

FINDINGS: Changes of right knee replacement. No hardware bony complicating
feature.
IMPRESSION: Right knee replacement.  No complicating feature.

## 2023-10-10 ENCOUNTER — Ambulatory Visit: Payer: Medicare Other | Admitting: Physician Assistant

## 2023-10-11 ENCOUNTER — Other Ambulatory Visit: Payer: Self-pay

## 2023-10-11 ENCOUNTER — Other Ambulatory Visit (INDEPENDENT_AMBULATORY_CARE_PROVIDER_SITE_OTHER): Payer: Self-pay

## 2023-10-11 ENCOUNTER — Ambulatory Visit: Admitting: Physician Assistant

## 2023-10-11 ENCOUNTER — Encounter: Payer: Self-pay | Admitting: Physician Assistant

## 2023-10-11 VITALS — Ht 70.0 in | Wt 252.0 lb

## 2023-10-11 DIAGNOSIS — M25552 Pain in left hip: Secondary | ICD-10-CM

## 2023-10-11 MED ORDER — LIDOCAINE HCL 1 % IJ SOLN
5.0000 mL | INTRAMUSCULAR | Status: AC | PRN
  Administered 2023-10-11: 5 mL

## 2023-10-11 MED ORDER — METHYLPREDNISOLONE ACETATE 40 MG/ML IJ SUSP
40.0000 mg | INTRAMUSCULAR | Status: AC | PRN
  Administered 2023-10-11: 40 mg via INTRA_ARTICULAR

## 2023-10-11 NOTE — Progress Notes (Signed)
 Office Visit Note   Patient: Kyle Meyers           Date of Birth: [birth year]           MRN: [id number] Visit Date: 10/11/2023              Requested by: Gillian Scarce, MD [address] [place],  [place] [address] PCP: Gillian Scarce, MD   Assessment & Plan: Visit Diagnoses:  1. Pain in left hip     Plan: Patient is a pleasant 78 year old gentleman who is a patient of Dr. Eliberto Ivory he comes in today with a chief complaint of left lateral hip pain.  Denies any injury but has had this problem going on and off for 2 years.  Does not have any radicular findings or any groin findings.  Does have some degenerative changes both in his hip and his back.  Based on his symptoms findings seem to be more consistent with a trochanteric bursitis.  He has a focal area of tenderness over the trochanteric bursa does radiate down the side of his leg but his strength is intact and neurovascular is intact.  We going to try some physical therapy and I will go ahead with an injection into the bursa today if this does not help him he will follow-up we can consider addressing the back I doubtful this is the hip  Follow-Up Instructions: No follow-ups on file.   Orders:  Orders Placed This Encounter  Procedures  . XR HIP UNILAT W OR W/O PELVIS 2-3 VIEWS LEFT   No orders of the defined types were placed in this encounter.     Procedures: Large Joint Inj: L greater trochanter on 10/11/2023 2:56 PM Indications: pain and diagnostic evaluation Details: 22 G 1.5 in needle, lateral approach  Arthrogram: No  Medications: 5 mL lidocaine 1 %; 40 mg methylPREDNISolone acetate 40 MG/ML Outcome: tolerated well, no immediate complications Procedure, treatment alternatives, risks and benefits explained, specific risks discussed. Consent was given by the patient.     Clinical Data: No additional findings.   Subjective: Chief Complaint  Patient presents with  . Left Hip - Pain    HPI Kyle Meyers is a  pleasant 78 year old gentleman with a chief complaint of left posterior lateral hip pain for 2 years.  He gets pain with prolonged standing.  Denies any groin pain.  It goes mostly from the posterior buttock to the lateral hip down to the just below the knee states the pain is constant.  Cannot sleep he has difficulty with steps and lifting his left leg he has tried taking Tylenol [place] any particular injury  Review of Systems  All other systems reviewed and are negative.    Objective: Vital Signs: There were no vitals taken for this visit.  Physical Exam Constitutional:      Appearance: Normal appearance.  Pulmonary:     Effort: Pulmonary effort is normal.  Neurological:     General: No focal deficit present.     Mental Status: He is alert and oriented to person, place, and time.  Psychiatric:        Mood and Affect: Mood normal.        Behavior: Behavior normal.    Ortho Exam Examination of his left hip he has no pain with range of motion cannot regroup reproduce any groin pain he is got 10 out of 10 strength with resisted flexion extension of his legs internal/external rotation.  No paresthesias.  He is focally  tender over the trochanteric bursa Specialty Comments:  No specialty comments available.  Imaging: No results found.   PMFS History: Patient Active Problem List   Diagnosis Date Noted  . Status post total knee replacement, right 11/18/2017  . Chronic pain of right knee 03/30/2017  . Unilateral primary osteoarthritis, right knee 03/30/2017   Past Medical History:  Diagnosis Date  . Complication of anesthesia   . GERD (gastroesophageal reflux disease)   . History of colon polyps   . Hypercholesteremia   . Hypertension   . OA (osteoarthritis)   . Obesity   . PONV (postoperative nausea and vomiting)    Vomiting only-Pt also reported excessive gas  . Rectal bleed 09/2017  . Reflux   . Ureteral stone     History reviewed. No pertinent family history.  Past  Surgical History:  Procedure Laterality Date  . APPENDECTOMY    . BILATERAL KNEE ARTHROSCOPY    . COLONOSCOPY  09/2017  . ESOPHAGOGASTRODUODENOSCOPY    . MOUTH SURGERY    . PALATE / UVULA BIOPSY / EXCISION    . TONSILLECTOMY    . TOTAL KNEE ARTHROPLASTY Right 11/18/2017   Procedure: RIGHT TOTAL KNEE ARTHROPLASTY;  Surgeon: Kathryne Hitch, MD;  Location: [facility];  Service: Orthopedics;  Laterality: Right;   Social History   Occupational History  . Not on file  Tobacco Use  . Smoking status: Never  . Smokeless tobacco: Never  Vaping Use  . Vaping status: Never Used  Substance and Sexual Activity  . Alcohol use: Yes    Comment: social  . Drug use: No  . Sexual activity: Not on file

## 2023-10-19 ENCOUNTER — Ambulatory Visit: Attending: Physician Assistant | Admitting: Physical Therapy

## 2023-10-19 ENCOUNTER — Other Ambulatory Visit: Payer: Self-pay

## 2023-10-19 ENCOUNTER — Encounter: Payer: Self-pay | Admitting: Physical Therapy

## 2023-10-19 DIAGNOSIS — M62838 Other muscle spasm: Secondary | ICD-10-CM | POA: Insufficient documentation

## 2023-10-19 DIAGNOSIS — M25552 Pain in left hip: Secondary | ICD-10-CM | POA: Diagnosis present

## 2023-10-19 DIAGNOSIS — M5459 Other low back pain: Secondary | ICD-10-CM | POA: Insufficient documentation

## 2023-10-19 DIAGNOSIS — R262 Difficulty in walking, not elsewhere classified: Secondary | ICD-10-CM | POA: Insufficient documentation

## 2023-10-19 DIAGNOSIS — M6281 Muscle weakness (generalized): Secondary | ICD-10-CM | POA: Diagnosis present

## 2023-10-19 NOTE — Therapy (Addendum)
 OUTPATIENT PHYSICAL THERAPY THORACOLUMBAR & LOWER EXTREMITY EVALUATION / DISCHARGE SUMMARY   Patient Name: Kyle Meyers MRN: [id number] DOB:1946-07-24, 78 y.o., male Today's Date: 10/19/2023  END OF SESSION:  PT End of Session - 10/19/23 1151     Visit Number 1    Date for PT Re-Evaluation 12/14/23    Authorization Type [employer]    PT Start Time 1151    PT Stop Time 1245    PT Time Calculation (min) 54 min    Activity Tolerance Patient tolerated treatment well    Behavior During Therapy WFL for tasks assessed/performed             Past Medical History:  Diagnosis Date   Complication of anesthesia    GERD (gastroesophageal reflux disease)    History of colon polyps    Hypercholesteremia    Hypertension    OA (osteoarthritis)    Obesity    PONV (postoperative nausea and vomiting)    Vomiting only-Pt also reported excessive gas   Rectal bleed 09/2017   Reflux    Ureteral stone    Past Surgical History:  Procedure Laterality Date   APPENDECTOMY     BILATERAL KNEE ARTHROSCOPY     COLONOSCOPY  09/2017   ESOPHAGOGASTRODUODENOSCOPY     MOUTH SURGERY     PALATE / UVULA BIOPSY / EXCISION     TONSILLECTOMY     TOTAL KNEE ARTHROPLASTY Right 11/18/2017   Procedure: RIGHT TOTAL KNEE ARTHROPLASTY;  Surgeon: Vernetta Lonni GRADE, MD;  Location: [facility];  Service: Orthopedics;  Laterality: Right;   Patient Active Problem List   Diagnosis Date Noted   Status post total knee replacement, right 11/18/2017   Chronic pain of right knee 03/30/2017   Unilateral primary osteoarthritis, right knee 03/30/2017    PCP: Alvie Catheryn POUR, MD   REFERRING PROVIDER: Persons, Ronal Dragon, PA   REFERRING DIAG: [phone] (ICD-10-CM) - Pain in left hip   THERAPY DIAG:  Pain in left hip  Other low back pain  Muscle weakness (generalized)  Difficulty in walking, not elsewhere classified  Other muscle spasm  RATIONALE FOR EVALUATION AND TREATMENT: Rehabilitation  ONSET DATE:  Chronic on and off for 2 years  NEXT MD VISIT: PRN / None scheduled   SUBJECTIVE:                                                                                                                                                                                                         SUBJECTIVE STATEMENT: 3 surgeries in last 4 weeks - 2  melanoma resections and stomach scope.  Reaction to different medications following stomach scope caused weakness and flu-like symptoms - reactions has caused him to be in bed most of the time for the past 2 months.  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  Pt reports his L hip has been a problem on and off for years - pain in lateral hip with intermittent radicular pain down to lateral knee.  Pain can keep him awake at night.  Pain interferes with sleep. Better since cortisone injection for GT bursitis.  PAIN: Are you having pain? Yes: NPRS scale: 0/10 currently, up to 4-5/10  Pain location: L hip and lateral leg Pain description: nagging, aching  Aggravating factors: prolonged standing or walking  Relieving factors: athletic creams, massage   Are you having pain? Yes: NPRS scale: 0/10 currently, up to 7-8/10  Pain location: L low back/buttock and B shoulders  Pain description: nagging  Aggravating factors: lifting heavy boxes  Relieving factors: flat on back in bed, ice   PERTINENT HISTORY:  R TKA 11/18/17, OA - back and hip, GERD, obesity, recent melanoma resections  PRECAUTIONS: None  RED FLAGS: None  WEIGHT BEARING RESTRICTIONS: No  FALLS:  Has patient fallen in last 6 months? 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 No acute fractures.  10/11/23 - XR pelvis:  Radiographs of his hip demonstrate some narrowing the joint space with sclerotic changes in the acetabulum.  PATIENT SURVEYS:  Modified Oswestry 20 / 50 = 40.0 %; moderate disability  LEFS 28 / 80 = 35.0 %, 65% disability  SCREENING FOR RED FLAGS: Bowel or bladder incontinence: No Spinal tumors: No Cauda equina syndrome: No Compression fracture: No Abdominal aneurysm: No  COGNITION:  Overall cognitive status: Within functional limits for tasks assessed    SENSATION: WFL  POSTURE:  rounded shoulders, forward head, decreased lumbar lordosis, and increased thoracic kyphosis  PALPATION: TTP over L greater trochanter and ITB; increased muscle tension/tightness in lumbar paraspinals and L>R gluteals  LUMBAR ROM:   Active  Eval  Flexion WFL  Extension 25% limited  Right lateral flexion Hand to lateral knee  Left lateral flexion Hand to lateral knee  Right rotation 25% limited  Left rotation 25% limited  (Blank rows = not tested)  MUSCLE LENGTH: Hamstrings: mild/mod tight B ITB: mod tight B Piriformis: mod tight B Hip flexors: mod/severe tight L>R Quads: mod tight B Heelcord: mild tight B  LOWER EXTREMITY ROM:    Grossly WFL with exceptions due to limited proximal flexibility as above - most limited in hip extension L>R  LOWER EXTREMITY MMT:    MMT Right eval Left eval  Hip flexion 3+ 3+ p!  Hip extension 4- 3+  Hip abduction 4 3-  Hip adduction 4+ 4  Hip internal rotation 4+  4  Hip external rotation 4 4-   Knee flexion 5 5  Knee extension 5 4+  Ankle dorsiflexion 4 4  Ankle plantarflexion    Ankle inversion    Ankle eversion     (Blank rows = not tested)  TODAY'S TREATMENT:   10/19/2023  SELF CARE:  Reviewed eval findings and role of PT in addressing identified deficits as well as instruction in initial HEP (see below).    PATIENT EDUCATION:  Education details: PT eval findings, anticipated POC, and initial HEP Person educated: Patient Education method: Explanation, Demonstration, Tactile cues, Verbal cues, and Handouts Education comprehension: verbalized understanding, returned demonstration, verbal cues required, tactile cues required, and needs further education  HOME EXERCISE PROGRAM: Access Code: [email or web address] URL: [email or web address] Date: 10/19/2023 Prepared by: Elijah Hidden  Exercises - Supine Hamstring Stretch with Strap  - 2 x daily - 7 x weekly - 3 reps - 30 sec hold - Supine Iliotibial Band Stretch with Strap (Mirrored)  - 2 x daily - 7 x weekly - 3 reps - 30 sec hold - Hip Flexor Stretch with Strap on Table  - 2 x daily - 7 x weekly - 3 reps - 30 sec hold - Supine Figure 4 Piriformis Stretch (Mirrored)  - 2 x daily - 7 x weekly - 3 reps - 30 sec hold - Supine Piriformis Stretch with Foot on Ground (Mirrored)  - 2 x daily - 7 x weekly - 3 reps - 30 sec hold   ASSESSMENT:  CLINICAL IMPRESSION: Kyle Meyers is a 78 y.o. male who was referred to physical therapy for evaluation and treatment for chronic L hip pain consistent with greater trochanteric bursitis.  He reports hip L pain and radicular pain along ITB has been an issue on and off for the past 2 years, with some relief noted from recent greater trochanteric bursa injection.   Patient reports onset of LBP beginning after lifting heavy crate to place it in his truck.  Pain has been exaggerated by weakness related to extended inactivity following adverse reaction to GI meds creating flulike symptoms and  causing him to remain mostly in bed for the past 2 months.  L hip pain is worse with prolonged standing and walking as well as lying on L side in bed.  Back pain is aggravated by heavy lifting.  Patient has deficits in low back and L>R hip ROM, proximal LE flexibility, BLE strength, abnormal posture, and TTP with abnormal muscle tension which are interfering with ADLs and are impacting quality of life.  On Modified Oswestry patient scored 20/50 demonstrating 40% or moderate disability. On LEFS patient scored 28/80 demonstrating 65% disability.  Kyle Meyers will benefit from skilled PT to address above deficits to improve mobility and activity tolerance with decreased pain interference.  Kyle Meyers mention possibility of reactivating his gym membership including pool access, therefore he may benefit from a few sessions of aquatic PT to established an aquatic-based HEP.     OBJECTIVE IMPAIRMENTS: decreased activity tolerance, decreased endurance, decreased knowledge of condition, decreased mobility, difficulty walking, decreased ROM, decreased strength, hypomobility, increased fascial restrictions, impaired perceived functional ability, increased muscle spasms, impaired flexibility, improper body mechanics, postural dysfunction, and pain.   ACTIVITY LIMITATIONS: carrying, lifting, bending, sitting, standing, squatting, sleeping, stairs, transfers, bed mobility, and locomotion level  PARTICIPATION LIMITATIONS: meal prep, cleaning, laundry, driving, community activity, occupation, yard work, and hobbies/recreational activities - woodworking and working at [employer].   PERSONAL FACTORS: Age, Past/current experiences, Time since onset  of injury/illness/exacerbation, and 3+ comorbidities: R TKA 11/18/17, OA - back and hip, GERD, obesity, recent melanoma resections are also affecting patient's functional outcome.   REHAB POTENTIAL: Good  CLINICAL DECISION MAKING: Evolving/moderate complexity  EVALUATION  COMPLEXITY: Moderate   GOALS: Goals reviewed with patient? Yes  SHORT TERM GOALS: Target date: 11/16/2023  Patient will be independent with initial HEP to improve outcomes and carryover.  Baseline:  Goal status: INITIAL  2.  Patient will report 25% improvement in low back and L hip pain to improve QOL. Baseline: LBP up to 7-8/10, L hip pain up to 4-5/10 Goal status: INITIAL  LONG TERM GOALS: Target date: 12/14/2023  Patient will be independent with ongoing/advanced HEP for self-management at home.  Baseline:  Goal status: INITIAL  2.  Patient will report 50-75% improvement in low back pain to improve QOL.  Baseline: LBP up to 7-8/10, L hip pain up to 4-5/10 Goal status: INITIAL  3.  Patient to demonstrate ability to achieve and maintain good spinal alignment/posturing and body mechanics needed for daily activities. Baseline:  Goal status: INITIAL  4.  Patient will demonstrate improved B LE strength to >/= 4+/5 for improved stability and ease of mobility. Baseline: Refer to above LE MMT table Goal status: INITIAL  5. Patient will report </= 28% on Modified Oswestry (MCID = 12%) to demonstrate improved functional ability with decreased pain interference. Baseline:  20 / 50 = 40.0 % Goal status: INITIAL  6.  Patient will report >/= 37/80 on LEFS (MCID = 9 points) to demonstrate improved functional ability with decreased pain interference. Baseline: 28 / 80 = 35.0 % Goal status: INITIAL  7.  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 Baseline: Currently limited to ~2 hours due to pain Goal status: INITIAL  8.  Patient will report <25% sleep disturbance due to low back or L hip pain.  Baseline: Sleep limited to 1/2-1/3 of normal time due to pain Goal status: INITIAL   PLAN:  PT FREQUENCY: 2x/week  PT DURATION: 8 weeks  PLANNED INTERVENTIONS: 97164- PT Re-evaluation,  97110-Therapeutic exercises, 97530- Therapeutic activity, 97112- Neuromuscular re-education, 97535- Self Care, 02859- Manual therapy, [phone]- Gait training, [phone]- Aquatic Therapy, [phone]- Electrical stimulation (unattended), [phone]- Ultrasound, 02987- Traction (mechanical), [phone]- Ionotophoresis 4mg /ml Dexamethasone , Patient/Family education, Balance training, Stair training, Taping, Dry Needling, Joint mobilization, Spinal mobilization, Cryotherapy, and Moist heat  PLAN FOR NEXT SESSION: Review initial HEP; progress lumbopelvic/proximal LE flexibility and initiate core/lumbopelvic strengthening; MT +/- TPDN to address abnormal muscle tension in lumbar paraspinals and glutes; modalities including possible ionto patch to L hip/greater trochanter; consider a few aquatic therapy sessions to establish pool-based HEP if patient plans to reactivate gym membership with pool access   Elijah CHRISTELLA Hidden, PT 10/19/2023, 1:46 PM   PHYSICAL THERAPY DISCHARGE SUMMARY  Visits from Start of Care: 1  Current functional level related to goals / functional outcomes: Refer to above clinical impression and goal assessment for status as of eval visit on 10/19/2023. Patient was hospitalized and had to cancel all scheduled f/u visits, therefore will proceed with discharge from PT for this episode.     Remaining deficits: As above. Unable to formally assess status at discharge due to inability to return to PT.    Education / Equipment: HEP   Patient agrees to discharge. Patient goals were not met. Patient is being discharged due to a change in medical  status.  Elijah EMERSON Hidden, PT 02/27/2024, 9:17 AM  [facility] [address]  [address] [place], [place], [address] Phone: [phone]   Fax:  [phone]

## 2023-10-21 ENCOUNTER — Encounter (HOSPITAL_COMMUNITY): Payer: Self-pay

## 2023-10-21 ENCOUNTER — Emergency Department (HOSPITAL_COMMUNITY)

## 2023-10-21 ENCOUNTER — Other Ambulatory Visit: Payer: Self-pay

## 2023-10-21 ENCOUNTER — Emergency Department (HOSPITAL_COMMUNITY)
Admission: EM | Admit: 2023-10-21 | Discharge: 2023-10-21 | Disposition: A | Discharge status: Home / Self Care | Attending: Emergency Medicine | Admitting: Emergency Medicine

## 2023-10-21 DIAGNOSIS — Z7901 Long term (current) use of anticoagulants: Secondary | ICD-10-CM | POA: Insufficient documentation

## 2023-10-21 DIAGNOSIS — E860 Dehydration: Secondary | ICD-10-CM | POA: Insufficient documentation

## 2023-10-21 DIAGNOSIS — R000 Tachycardia, unspecified: Secondary | ICD-10-CM | POA: Insufficient documentation

## 2023-10-21 DIAGNOSIS — I959 Hypotension, unspecified: Secondary | ICD-10-CM | POA: Insufficient documentation

## 2023-10-21 DIAGNOSIS — R531 Weakness: Secondary | ICD-10-CM | POA: Insufficient documentation

## 2023-10-21 LAB — CBC WITH DIFFERENTIAL/PLATELET
Abs Immature Granulocytes: 0.4 10*3/uL — ABNORMAL HIGH (ref 0.00–0.07)
Basophils Absolute: 0 10*3/uL (ref 0.0–0.1)
Basophils Relative: 0 %
Eosinophils Absolute: 0 10*3/uL (ref 0.0–0.5)
Eosinophils Relative: 0 %
HCT: 34.3 % — ABNORMAL LOW (ref 39.0–52.0)
Hemoglobin: 11.1 g/dL — ABNORMAL LOW (ref 13.0–17.0)
Immature Granulocytes: 4 %
Lymphocytes Relative: 14 %
Lymphs Abs: 1.4 10*3/uL (ref 0.7–4.0)
MCH: 25.6 pg — ABNORMAL LOW (ref 26.0–34.0)
MCHC: 32.4 g/dL (ref 30.0–36.0)
MCV: 79 fL — ABNORMAL LOW (ref 80.0–100.0)
Monocytes Absolute: 0.7 10*3/uL (ref 0.1–1.0)
Monocytes Relative: 8 %
Neutro Abs: 6.8 10*3/uL (ref 1.7–7.7)
Neutrophils Relative %: 74 %
Platelets: 213 10*3/uL (ref 150–400)
RBC: 4.34 MIL/uL (ref 4.22–5.81)
RDW: 16.8 % — ABNORMAL HIGH (ref 11.5–15.5)
WBC: 9.4 10*3/uL (ref 4.0–10.5)
nRBC: 0 % (ref 0.0–0.2)

## 2023-10-21 LAB — COMPREHENSIVE METABOLIC PANEL
ALT: 49 U/L — ABNORMAL HIGH (ref 0–44)
AST: 52 U/L — ABNORMAL HIGH (ref 15–41)
Albumin: 2.5 g/dL — ABNORMAL LOW (ref 3.5–5.0)
Alkaline Phosphatase: 341 U/L — ABNORMAL HIGH (ref 38–126)
Anion gap: 14 (ref 5–15)
BUN: 27 mg/dL — ABNORMAL HIGH (ref 8–23)
CO2: 23 mmol/L (ref 22–32)
Calcium: 8.4 mg/dL — ABNORMAL LOW (ref 8.9–10.3)
Chloride: 92 mmol/L — ABNORMAL LOW (ref 98–111)
Creatinine, Ser: 1.01 mg/dL (ref 0.61–1.24)
GFR, Estimated: >60 mL/min (ref >60)
Glucose, Bld: 102 mg/dL — ABNORMAL HIGH (ref 70–99)
Potassium: 3.9 mmol/L (ref 3.5–5.1)
Sodium: 129 mmol/L — ABNORMAL LOW (ref 135–145)
Total Bilirubin: 1.2 mg/dL (ref 0.0–1.2)
Total Protein: 6.2 g/dL — ABNORMAL LOW (ref 6.5–8.1)

## 2023-10-21 MED ORDER — SODIUM CHLORIDE 0.9 % IV SOLN: INTRAVENOUS | Status: DC

## 2023-10-21 NOTE — ED Notes (Signed)
 Pt has returned from MRI.

## 2023-10-21 NOTE — Discharge Instructions (Addendum)
 Your liver function test was slightly elevated today.  Follow-up with your doctor for this.

## 2023-10-21 NOTE — ED Notes (Signed)
..  The patient is A&OX4, ambulatory at d/c with independent steady gait, NAD but wheeled out of ED via wheelchair. Pt verbalized understanding of d/c instructions and follow up care.

## 2023-10-21 NOTE — ED Notes (Signed)
 Patient transported to MRI

## 2023-10-21 NOTE — ED Triage Notes (Signed)
 PER EMS: pt is from home with c/o confusion, disorientation and garbled speech after he stood up after sitting down today at 1530. Episode only lasted a few minutes. EMS states he was + for orthostatic hypotension:   BP supine: 138/88, HR-100 Sitting: 88/60, HR-120  BP- 118/78 after he received 500cc NaCl and HR-96. CBG-108  He is currently A&Ox4, his speech is back to his baseline per his wife.

## 2023-10-21 NOTE — ED Provider Notes (Addendum)
 Kyle Meyers Provider Note   CSN: [id number] Arrival date & time: 10/21/23  1717     History  Chief Complaint  Patient presents with   Hypotension    Kyle Meyers is a 78 y.o. male.  78 year old male presents with increased weakness that began today.  Patient states that he went to stand up and became lightheaded and dizzy.  Relates that she is starting a new medication for acid reflux.  EMS was called and patient was found to be confused disoriented and with garbled speech.  That only lasted for few minutes.  When EMS arrived, he had sitting blood pressure of 88/60.  Was given 500 cc bolus of saline.  Blood sugar was noted to be 108.  Patient denies any recent history of vomiting or diarrhea.  No recent black or bloody stools.  States has not been short of breath.  No fever or chills.  No new medications started.  He denies any history of A-fib       Home Medications Prior to Admission medications   Medication Sig Start Date End Date Taking? Authorizing Provider  amLODipine (NORVASC) 5 MG tablet Take 1 tablet by mouth daily.  Patient not taking: Reported on 10/19/2023    [provider]  atorvastatin (LIPITOR) 20 MG tablet Take 1 tablet by mouth every morning.     [provider]  Cholecalciferol (VITAMIN D) 2000 units tablet Take 2,000 Units by mouth daily. Patient not taking: Reported on 10/19/2023    [provider]  ketoconazole (NIZORAL) 2 % cream Apply 1 application topically daily as needed for irritation.    [provider]  lisinopril-hydrochlorothiazide (PRINZIDE,ZESTORETIC) 10-12.5 MG tablet Take 1 tablet by mouth daily.    [provider]  methocarbamol (ROBAXIN) 500 MG tablet Take 1 tablet (500 mg total) by mouth every 6 (six) hours as needed for muscle spasms. Patient not taking: Reported on 10/19/2023 11/19/17   Kathryne Hitch, MD  Multiple Vitamins-Minerals  (MULTIVITAMIN WITH MINERALS) tablet Take 1 tablet by mouth daily. Patient not taking: Reported on 10/19/2023    [provider]  omeprazole (PRILOSEC) 40 MG capsule Take 40 mg by mouth at bedtime.    [provider]  oxyCODONE (OXY IR/ROXICODONE) 5 MG immediate release tablet Take 1-2 tablets (5-10 mg total) by mouth every 4 (four) hours as needed for moderate pain (pain score 4-6). Patient not taking: Reported on 10/19/2023 11/19/17   Kathryne Hitch, MD  rivaroxaban (XARELTO) 10 MG TABS tablet Take 1 tablet (10 mg total) by mouth daily with breakfast. Patient not taking: Reported on 10/19/2023 11/20/17   Kathryne Hitch, MD      Allergies    Patient has no known allergies.    Review of Systems   Review of Systems  All other systems reviewed and are negative.   Physical Exam Updated Vital Signs BP 111/64 (BP Location: Right Arm)   Pulse 95   Temp 98.9 F (37.2 C) (Oral)   Resp 18   Ht 1.778 m (5\' 10" )   Wt 114.3 kg   SpO2 100%   BMI 36.16 kg/m  Physical Exam Vitals and nursing note reviewed.  Constitutional:      General: He is not in acute distress.    Appearance: Normal appearance. He is well-developed. He is not toxic-appearing.  HENT:     Head: Normocephalic and atraumatic.  Eyes:     General: Lids are normal.  Conjunctiva/sclera: Conjunctivae normal.     Pupils: Pupils are equal, round, and reactive to light.  Neck:     Thyroid: No thyroid mass.     Trachea: No tracheal deviation.  Cardiovascular:     Rate and Rhythm: Tachycardia present. Rhythm irregular.     Heart sounds: Normal heart sounds. No murmur heard.    No gallop.  Pulmonary:     Effort: Pulmonary effort is normal. No respiratory distress.     Breath sounds: Normal breath sounds. No stridor. No decreased breath sounds, wheezing, rhonchi or rales.  Abdominal:     General: There is no distension.     Palpations: Abdomen is soft.     Tenderness: There is no abdominal  tenderness. There is no rebound.  Musculoskeletal:        General: No tenderness. Normal range of motion.     Cervical back: Normal range of motion and neck supple.  Skin:    General: Skin is warm and dry.     Findings: No abrasion or rash.  Neurological:     Mental Status: He is alert and oriented to person, place, and time.     GCS: GCS eye subscore is 4. GCS verbal subscore is 5. GCS motor subscore is 6.     Cranial Nerves: No cranial nerve deficit.     Sensory: No sensory deficit.     Motor: Motor function is intact.  Psychiatric:        Attention and Perception: Attention normal.        Speech: Speech normal.        Behavior: Behavior normal.     ED Results / Procedures / Treatments   Labs (all labs ordered are listed, but only abnormal results are displayed) Labs Reviewed  CBC WITH DIFFERENTIAL/PLATELET  COMPREHENSIVE METABOLIC PANEL   ED ECG REPORT   Date: 10/21/2023  Rate: 90  Rhythm: normal sinus rhythm  QRS Axis: normal  Intervals: normal  ST/T Wave abnormalities: normal  Conduction Disutrbances:none  Narrative Interpretation:   Old EKG Reviewed: none available  I have personally reviewed the EKG tracing and agree with the computerized printout as noted.  No results found.  Procedures Procedures    Medications Ordered in ED Medications  0.9 %  sodium chloride infusion (has no administration in time range)    ED Course/ Medical Decision Making/ A&P                                 Medical Decision Making Amount and/or Complexity of Data Reviewed Labs: ordered. Radiology: ordered.  Risk Prescription drug management.   Patient here after having weakness and hypotension.  Improved significantly after patient received IV fluids.  He was orthostatic initially.  Blood pressure now stable.  Patient's EKG does show sinus rhythm.  On the monitor in the room I question if he had A-fib however on his EKG it does continue shows sinus rhythm.  To perform a  head CT as well as MRI which was negative for CVA.  Patient is completely back to his normal baseline at this time.  Labs do show mild elevation of his LFTs.  His bilirubin has however normal.  He has a normal white count he will need to follow-up with his doctor for this.  I did repeat his EKG which continues to show sinus rhythm.  Low suspicion that he is having paroxysmal A-fib.  He notes that  he has been drinking less recently.  Will discharge home and return precautions given        Final Clinical Impression(s) / ED Diagnoses Final diagnoses:  None    Rx / DC Orders ED Discharge Orders     None         Lorre Nick, MD 10/21/23 2259    Lorre Nick, MD 10/21/23 2300

## 2023-10-25 ENCOUNTER — Other Ambulatory Visit: Payer: Self-pay | Admitting: Gastroenterology

## 2023-10-25 DIAGNOSIS — R7989 Other specified abnormal findings of blood chemistry: Secondary | ICD-10-CM

## 2023-10-25 DIAGNOSIS — R634 Abnormal weight loss: Secondary | ICD-10-CM

## 2023-10-25 DIAGNOSIS — R630 Anorexia: Secondary | ICD-10-CM

## 2023-10-27 ENCOUNTER — Ambulatory Visit
Admission: RE | Admit: 2023-10-27 | Discharge: 2023-10-27 | Discharge status: Home / Self Care | Source: Ambulatory Visit | Attending: Gastroenterology

## 2023-10-27 DIAGNOSIS — R634 Abnormal weight loss: Secondary | ICD-10-CM

## 2023-10-27 DIAGNOSIS — R7989 Other specified abnormal findings of blood chemistry: Secondary | ICD-10-CM

## 2023-10-27 DIAGNOSIS — R630 Anorexia: Secondary | ICD-10-CM

## 2023-10-27 MED ORDER — IOPAMIDOL (ISOVUE-300) INJECTION 61%
100.0000 mL | Freq: Once | INTRAVENOUS | Status: AC | PRN
  Administered 2023-10-27: 100 mL via INTRAVENOUS

## 2023-10-31 ENCOUNTER — Encounter: Payer: Self-pay | Admitting: Physical Therapy

## 2023-11-09 ENCOUNTER — Encounter: Payer: Self-pay | Admitting: Physical Therapy

## 2023-11-14 ENCOUNTER — Encounter: Payer: Self-pay | Admitting: Physical Therapy

## 2023-11-21 ENCOUNTER — Encounter: Payer: Self-pay | Admitting: Physical Therapy

## 2023-12-02 ENCOUNTER — Other Ambulatory Visit (HOSPITAL_COMMUNITY): Payer: Self-pay | Admitting: Urology

## 2023-12-02 DIAGNOSIS — C61 Malignant neoplasm of prostate: Secondary | ICD-10-CM

## 2023-12-02 DIAGNOSIS — C7951 Secondary malignant neoplasm of bone: Secondary | ICD-10-CM

## 2023-12-14 ENCOUNTER — Encounter (HOSPITAL_COMMUNITY)
Admission: RE | Admit: 2023-12-14 | Discharge: 2023-12-14 | Disposition: A | Discharge status: Home / Self Care | Source: Ambulatory Visit | Attending: Urology | Admitting: Urology

## 2023-12-14 ENCOUNTER — Ambulatory Visit (HOSPITAL_COMMUNITY)
Admission: RE | Admit: 2023-12-14 | Discharge: 2023-12-14 | Disposition: A | Discharge status: Home / Self Care | Source: Ambulatory Visit | Attending: Urology | Admitting: Urology

## 2023-12-14 DIAGNOSIS — C7951 Secondary malignant neoplasm of bone: Secondary | ICD-10-CM | POA: Insufficient documentation

## 2023-12-14 DIAGNOSIS — C61 Malignant neoplasm of prostate: Secondary | ICD-10-CM | POA: Diagnosis present

## 2023-12-14 MED ORDER — TECHNETIUM TC 99M MEDRONATE IV KIT
20.0000 | PACK | Freq: Once | INTRAVENOUS | Status: AC | PRN
  Administered 2023-12-14: 20.5 via INTRAVENOUS

## 2024-06-11 ENCOUNTER — Encounter: Payer: Self-pay | Admitting: Radiology
